# Patient Record
Sex: Female | Born: 1970 | Race: White | Hispanic: No | Marital: Married | Smoking: Never smoker
Health system: Southern US, Community
[De-identification: ages and names within clinical notes are randomized; demographics above are authoritative.]

## PROBLEM LIST (undated history)

## (undated) DIAGNOSIS — E079 Disorder of thyroid, unspecified: Secondary | ICD-10-CM

## (undated) DIAGNOSIS — T7840XA Allergy, unspecified, initial encounter: Secondary | ICD-10-CM

## (undated) DIAGNOSIS — I10 Essential (primary) hypertension: Secondary | ICD-10-CM

## (undated) HISTORY — DX: Essential (primary) hypertension: I10

## (undated) HISTORY — DX: Allergy, unspecified, initial encounter: T78.40XA

---
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
## 1999-01-20 ENCOUNTER — Other Ambulatory Visit: Admission: RE | Admit: 1999-01-20 | Discharge: 1999-01-20 | Payer: Self-pay | Admitting: Obstetrics and Gynecology

## 2001-03-30 ENCOUNTER — Ambulatory Visit (HOSPITAL_COMMUNITY): Admission: RE | Admit: 2001-03-30 | Discharge: 2001-03-30 | Payer: Self-pay | Admitting: Obstetrics and Gynecology

## 2001-03-30 ENCOUNTER — Encounter: Payer: Self-pay | Admitting: Obstetrics and Gynecology

## 2001-06-07 ENCOUNTER — Other Ambulatory Visit: Admission: RE | Admit: 2001-06-07 | Discharge: 2001-06-07 | Payer: Self-pay | Admitting: Obstetrics and Gynecology

## 2001-12-23 ENCOUNTER — Inpatient Hospital Stay (HOSPITAL_COMMUNITY): Admission: AD | Admit: 2001-12-23 | Discharge: 2001-12-25 | Payer: Self-pay | Admitting: Obstetrics and Gynecology

## 2001-12-26 ENCOUNTER — Encounter: Admission: RE | Admit: 2001-12-26 | Discharge: 2002-01-25 | Payer: Self-pay | Admitting: Obstetrics and Gynecology

## 2002-01-23 ENCOUNTER — Other Ambulatory Visit: Admission: RE | Admit: 2002-01-23 | Discharge: 2002-01-23 | Payer: Self-pay | Admitting: Obstetrics and Gynecology

## 2003-03-29 ENCOUNTER — Other Ambulatory Visit: Admission: RE | Admit: 2003-03-29 | Discharge: 2003-03-29 | Payer: Self-pay | Admitting: Obstetrics and Gynecology

## 2004-05-28 ENCOUNTER — Other Ambulatory Visit: Admission: RE | Admit: 2004-05-28 | Discharge: 2004-05-28 | Payer: Self-pay | Admitting: Obstetrics and Gynecology

## 2004-06-15 ENCOUNTER — Emergency Department (HOSPITAL_COMMUNITY): Admission: EM | Admit: 2004-06-15 | Discharge: 2004-06-15 | Payer: Self-pay | Admitting: Emergency Medicine

## 2004-06-16 ENCOUNTER — Emergency Department (HOSPITAL_COMMUNITY): Admission: EM | Admit: 2004-06-16 | Discharge: 2004-06-16 | Payer: Self-pay | Admitting: Emergency Medicine

## 2005-10-26 ENCOUNTER — Other Ambulatory Visit: Admission: RE | Admit: 2005-10-26 | Discharge: 2005-10-26 | Payer: Self-pay | Admitting: Obstetrics and Gynecology

## 2006-12-08 ENCOUNTER — Inpatient Hospital Stay (HOSPITAL_COMMUNITY): Admission: RE | Admit: 2006-12-08 | Discharge: 2006-12-10 | Payer: Self-pay | Admitting: Obstetrics and Gynecology

## 2015-10-29 DIAGNOSIS — Z6833 Body mass index (BMI) 33.0-33.9, adult: Secondary | ICD-10-CM | POA: Diagnosis not present

## 2015-10-29 DIAGNOSIS — Z01419 Encounter for gynecological examination (general) (routine) without abnormal findings: Secondary | ICD-10-CM | POA: Diagnosis not present

## 2015-10-29 DIAGNOSIS — E049 Nontoxic goiter, unspecified: Secondary | ICD-10-CM | POA: Diagnosis not present

## 2015-10-29 DIAGNOSIS — Z1231 Encounter for screening mammogram for malignant neoplasm of breast: Secondary | ICD-10-CM | POA: Diagnosis not present

## 2015-10-30 ENCOUNTER — Other Ambulatory Visit (HOSPITAL_COMMUNITY): Payer: Self-pay | Admitting: Obstetrics and Gynecology

## 2015-10-30 DIAGNOSIS — E049 Nontoxic goiter, unspecified: Secondary | ICD-10-CM

## 2015-11-06 ENCOUNTER — Ambulatory Visit (HOSPITAL_COMMUNITY)
Admission: RE | Admit: 2015-11-06 | Discharge: 2015-11-06 | Disposition: A | Discharge status: Home / Self Care | Payer: 59 | Source: Ambulatory Visit | Attending: Obstetrics and Gynecology | Admitting: Obstetrics and Gynecology

## 2015-11-06 DIAGNOSIS — R590 Localized enlarged lymph nodes: Secondary | ICD-10-CM | POA: Insufficient documentation

## 2015-11-06 DIAGNOSIS — E049 Nontoxic goiter, unspecified: Secondary | ICD-10-CM | POA: Diagnosis not present

## 2015-11-06 DIAGNOSIS — E079 Disorder of thyroid, unspecified: Secondary | ICD-10-CM | POA: Diagnosis not present

## 2015-11-10 ENCOUNTER — Other Ambulatory Visit: Payer: Self-pay | Admitting: Surgery

## 2015-11-10 DIAGNOSIS — E041 Nontoxic single thyroid nodule: Secondary | ICD-10-CM

## 2015-11-25 ENCOUNTER — Other Ambulatory Visit (HOSPITAL_COMMUNITY)
Admission: RE | Admit: 2015-11-25 | Discharge: 2015-11-25 | Disposition: A | Discharge status: Home / Self Care | Payer: 59 | Source: Ambulatory Visit | Attending: Interventional Radiology | Admitting: Interventional Radiology

## 2015-11-25 ENCOUNTER — Ambulatory Visit
Admission: RE | Admit: 2015-11-25 | Discharge: 2015-11-25 | Disposition: A | Discharge status: Home / Self Care | Payer: 59 | Source: Ambulatory Visit | Attending: Surgery | Admitting: Surgery

## 2015-11-25 DIAGNOSIS — E041 Nontoxic single thyroid nodule: Secondary | ICD-10-CM

## 2015-11-25 DIAGNOSIS — E0789 Other specified disorders of thyroid: Secondary | ICD-10-CM | POA: Diagnosis not present

## 2015-11-25 DIAGNOSIS — E042 Nontoxic multinodular goiter: Secondary | ICD-10-CM | POA: Diagnosis not present

## 2015-12-01 DIAGNOSIS — E042 Nontoxic multinodular goiter: Secondary | ICD-10-CM | POA: Diagnosis not present

## 2016-04-01 DIAGNOSIS — J029 Acute pharyngitis, unspecified: Secondary | ICD-10-CM | POA: Diagnosis not present

## 2016-05-29 ENCOUNTER — Encounter (HOSPITAL_COMMUNITY): Payer: Self-pay | Admitting: Family Medicine

## 2016-05-29 ENCOUNTER — Ambulatory Visit (HOSPITAL_COMMUNITY)
Admission: EM | Admit: 2016-05-29 | Discharge: 2016-05-29 | Disposition: A | Discharge status: Home / Self Care | Payer: 59 | Attending: Internal Medicine | Admitting: Internal Medicine

## 2016-05-29 ENCOUNTER — Ambulatory Visit (INDEPENDENT_AMBULATORY_CARE_PROVIDER_SITE_OTHER): Payer: 59

## 2016-05-29 DIAGNOSIS — M25561 Pain in right knee: Secondary | ICD-10-CM

## 2016-05-29 DIAGNOSIS — S92301A Fracture of unspecified metatarsal bone(s), right foot, initial encounter for closed fracture: Secondary | ICD-10-CM

## 2016-05-29 DIAGNOSIS — M79671 Pain in right foot: Secondary | ICD-10-CM | POA: Diagnosis not present

## 2016-05-29 DIAGNOSIS — S8991XA Unspecified injury of right lower leg, initial encounter: Secondary | ICD-10-CM | POA: Diagnosis not present

## 2016-05-29 DIAGNOSIS — S99921A Unspecified injury of right foot, initial encounter: Secondary | ICD-10-CM | POA: Diagnosis not present

## 2016-05-29 NOTE — ED Triage Notes (Signed)
Pt here for pain in right knee and right foot after hearing a pop in her right knee today. sts that her legs were knocked out from under her causing her to fall. sts the pain is worse in her foot.

## 2016-05-29 NOTE — ED Provider Notes (Signed)
CSN: [id number]     Arrival date & time 05/29/16  1350 History   First MD Initiated Contact with Patient 05/29/16 1509     Chief Complaint  Patient presents with  . Knee Pain  . Foot Pain   (Consider location/radiation/quality/duration/timing/severity/associated sxs/prior Treatment) HPI 45 Y/O FEMALE AT [employer], WAS PLAYING WITH KIDS, WHEN SHE WAS HIT CAUSING HER RIGHT KNEE TO POP, AND TWISTING RIGHT FOOT, WITH ONSET OF IMMEDIATE PAIN. UNABLE TO WEIGHT BEAR. TRANSPORTED BY PRIVATE CAR. NO FRACTURE BEFORE.  History reviewed. No pertinent past medical history. History reviewed. No pertinent surgical history. History reviewed. No pertinent family history. Social History  Substance Use Topics  . Smoking status: Never Smoker  . Smokeless tobacco: Never Used  . Alcohol use Not on file   OB History    No data available     Review of Systems  Denies: HEADACHE, NAUSEA, ABDOMINAL PAIN, CHEST PAIN, CONGESTION, DYSURIA, SHORTNESS OF BREATH  Allergies  Ceftin [cefuroxime axetil]  Home Medications   Prior to Admission medications   Not on File   Meds Ordered and Administered this Visit  Medications - No data to display  BP 139/99   Pulse 92   Temp 98.3 F (36.8 C)   Resp 18   LMP 05/14/2016   SpO2 100%  No data found.   Physical Exam NURSES NOTES AND VITAL SIGNS REVIEWED. CONSTITUTIONAL: Well developed, well nourished, no acute distress HEENT: normocephalic, atraumatic EYES: Conjunctiva normal NECK:normal ROM, supple, no adenopathy PULMONARY:No respiratory distress, normal effort ABDOMINAL: Soft, ND, NT BS+, No CVAT MUSCULOSKELETAL: Normal ROM of all extremities, RIGHT KNEE IS WITHOUT SWELLING, TENDER LATERALLY BUT JOINT IS STABLE TO VALGUS AND VARUS STRESS.  THERE IS TENDERNESS AND SWELLING WITH SMALL AMOUNT OF BRUSING OVER TOP OF FOOT. NEAR 4-5 MT BASE.  SKIN: warm and dry without rash PSYCHIATRIC: Mood and affect, behavior are normal  Urgent Care Course    Clinical Course    Procedures (including critical care time)  Labs Review Labs Reviewed - No data to display  Imaging Review Dg Knee Complete 4 Views Right  Result Date: 05/29/2016 CLINICAL DATA:  Pain after trauma EXAM: RIGHT KNEE - COMPLETE 4+ VIEW COMPARISON:  None. FINDINGS: No evidence of fracture, dislocation, or joint effusion. No evidence of arthropathy or other focal bone abnormality. Soft tissues are unremarkable. IMPRESSION: Negative. Electronically Signed   By: Gerome Samavid  Williams III M.D   On: 05/29/2016 16:55   Dg Foot Complete Right  Result Date: 05/29/2016 CLINICAL DATA:  Pain after trauma EXAM: RIGHT FOOT COMPLETE - 3+ VIEW COMPARISON:  None. FINDINGS: Mild irregularity at the base of the fourth metatarsal. Degenerative changes. No other abnormalities. IMPRESSION: Mild irregularity at the base of the fourth metatarsal only seen on the oblique view. A subtle fracture is not excluded on these images. Recommend clinical correlation to assess for point tenderness in this region. No other abnormalities. Electronically Signed   By: Gerome Samavid  Williams III M.D   On: 05/29/2016 16:59   DISCUSSED WITH PATIENT AND HUSBANDE PRIOR TO DISCHARGE.   Visual Acuity Review  Right Eye Distance:   Left Eye Distance:   Bilateral Distance:    Right Eye Near:   Left Eye Near:    Bilateral Near:        45 Y/O FEMALE WITH INJURY TO RIGHT LEG. FX OF 4TH MT, KNEE SPRAIN, CAM WALKER, CRUTCHES PODIATRY FOLLOW UP MDM   1. Knee pain, acute, right   2. Metatarsal fracture, right,  closed, initial encounter     Patient is reassured that there are no issues that require transfer to higher level of care at this time or additional tests. Patient is advised to continue home symptomatic treatment. Patient is advised that if there are new or worsening symptoms to attend the emergency department, contact primary care provider, or return to UC. Instructions of care provided discharged home in stable  condition.    THIS NOTE WAS GENERATED USING A VOICE RECOGNITION SOFTWARE PROGRAM. ALL REASONABLE EFFORTS  WERE MADE TO PROOFREAD THIS DOCUMENT FOR ACCURACY.  I have verbally reviewed the discharge instructions with the patient. A printed AVS was given to the patient.  All questions were answered prior to discharge.      Tharon AquasFrank C Patrick, PA 05/30/16 1009

## 2016-05-31 DIAGNOSIS — M79671 Pain in right foot: Secondary | ICD-10-CM | POA: Diagnosis not present

## 2016-05-31 DIAGNOSIS — S92344A Nondisplaced fracture of fourth metatarsal bone, right foot, initial encounter for closed fracture: Secondary | ICD-10-CM | POA: Diagnosis not present

## 2016-05-31 DIAGNOSIS — S838X1A Sprain of other specified parts of right knee, initial encounter: Secondary | ICD-10-CM | POA: Diagnosis not present

## 2016-06-10 DIAGNOSIS — S92344D Nondisplaced fracture of fourth metatarsal bone, right foot, subsequent encounter for fracture with routine healing: Secondary | ICD-10-CM | POA: Diagnosis not present

## 2016-07-01 DIAGNOSIS — S92344D Nondisplaced fracture of fourth metatarsal bone, right foot, subsequent encounter for fracture with routine healing: Secondary | ICD-10-CM | POA: Diagnosis not present

## 2016-07-01 DIAGNOSIS — S99821D Other specified injuries of right foot, subsequent encounter: Secondary | ICD-10-CM | POA: Diagnosis not present

## 2016-10-07 IMAGING — US US THYROID BIOPSY
1 series · 14 of 22 positions shown · non-contrast
Comparison: 11/06/2015

CLINICAL DATA: Dominant isthmus and left thyroid nodules

EXAM:
ULTRASOUND GUIDED NEEDLE ASPIRATE BIOPSY OF THE THYROID GLAND

[Series 1: us thyroid biopsy · 0.06mm/px · 22 acquisitions, 14 frames shown]
[im 1/22]
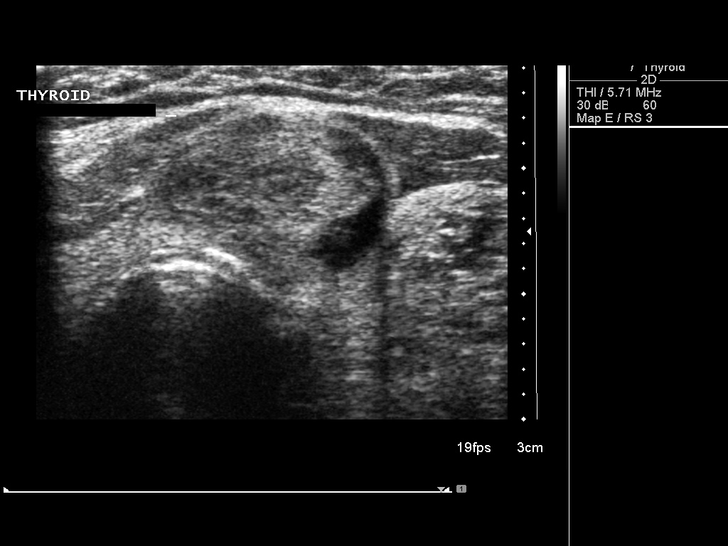
[im 3/22]
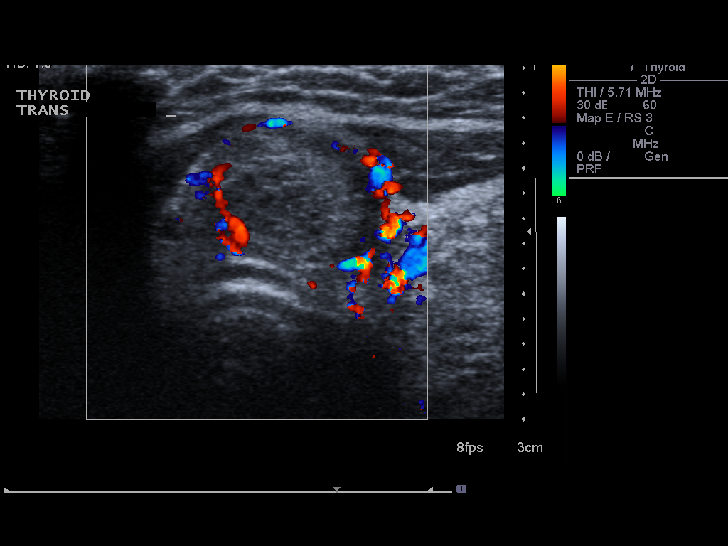
[im 4/22]
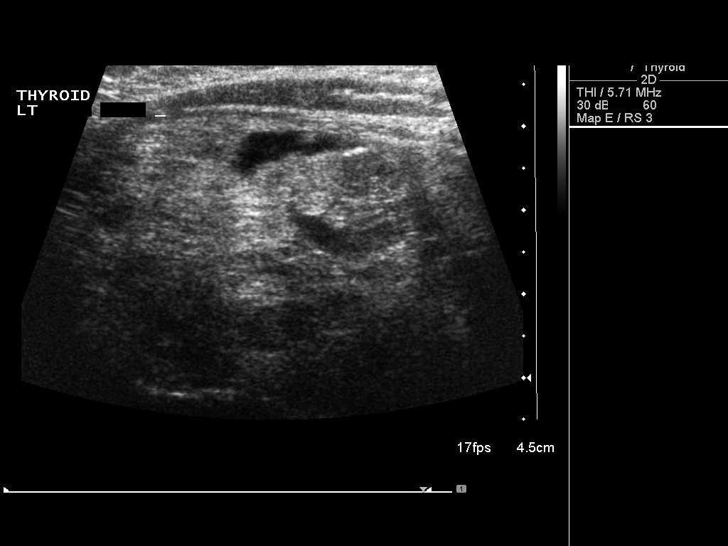
[im 6/22]
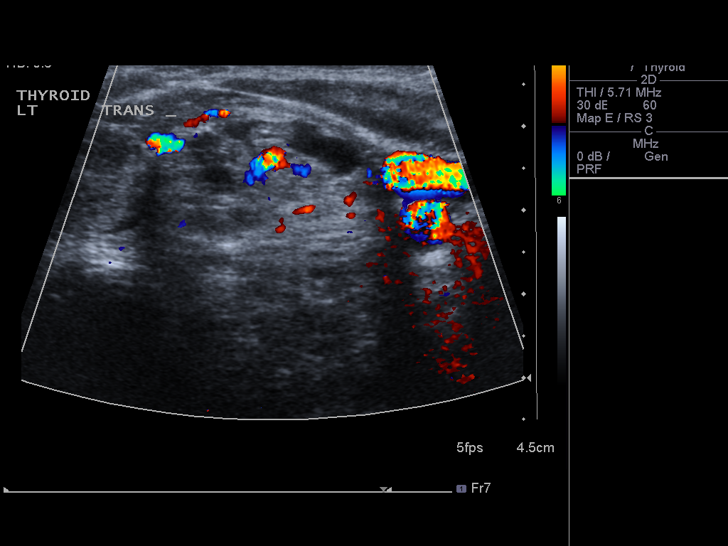
[im 8/22]
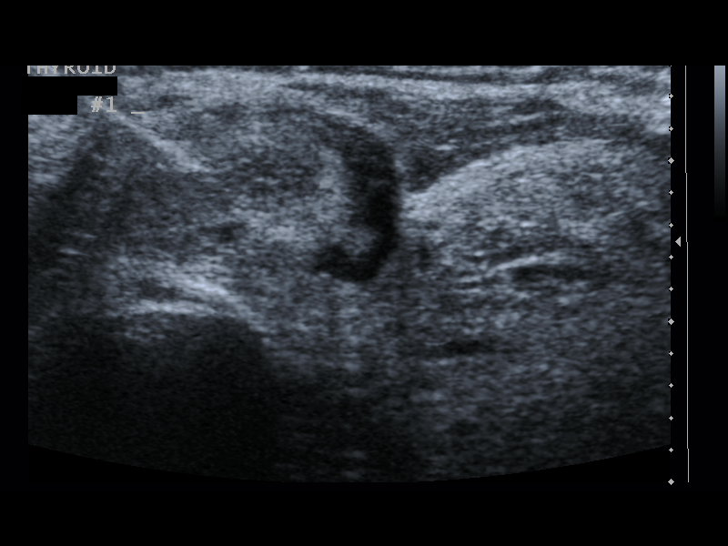
[im 9/22]
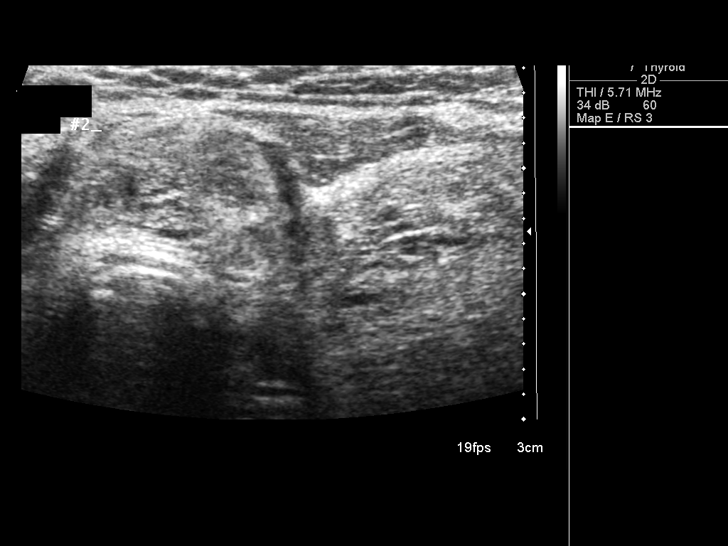
[im 11/22]
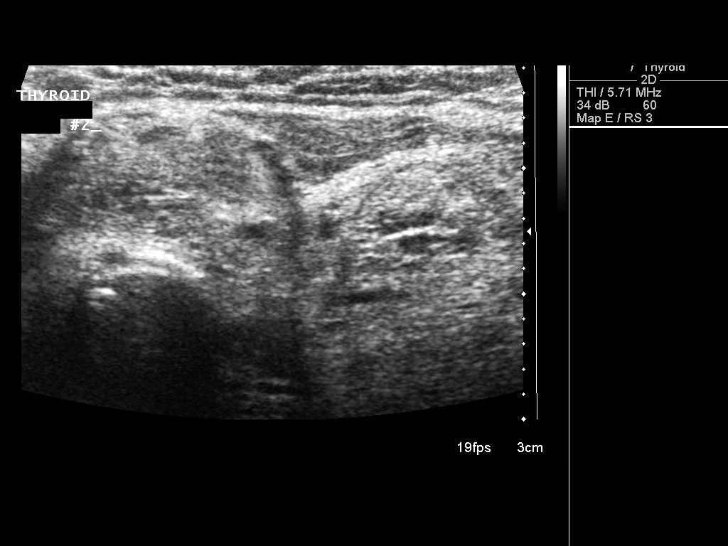
[im 12/22]
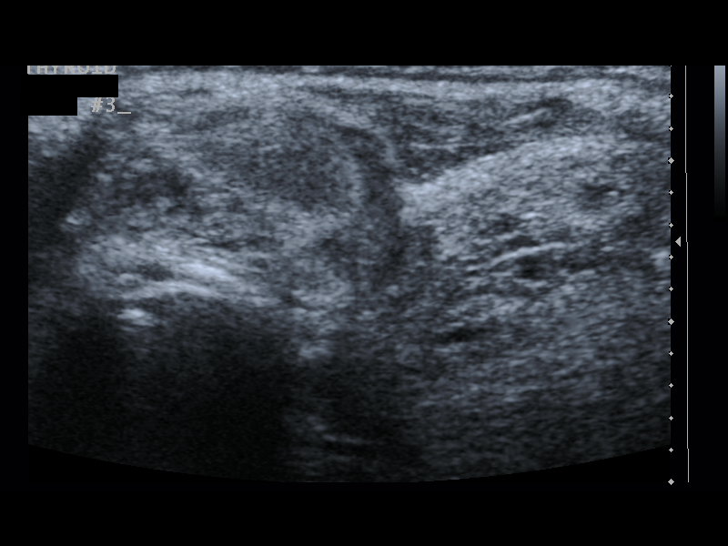
[im 14/22]
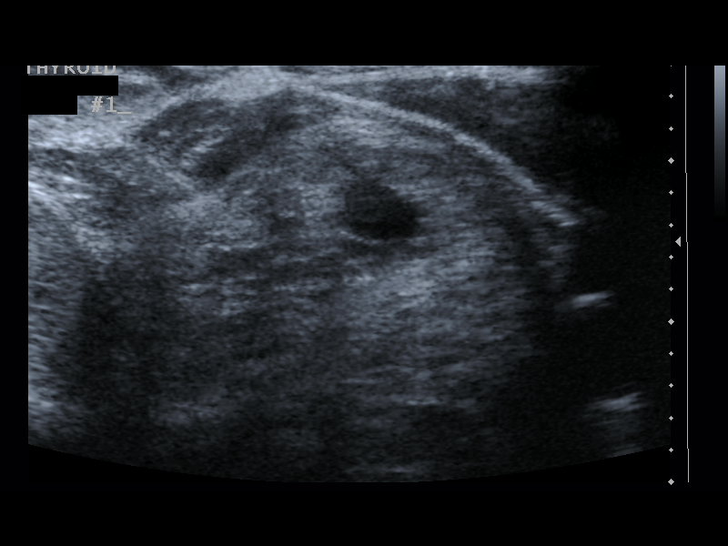
[im 15/22]
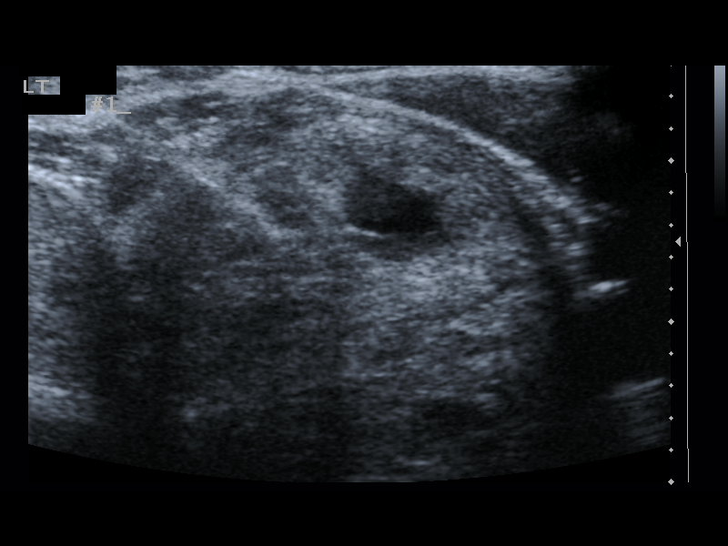
[im 17/22]
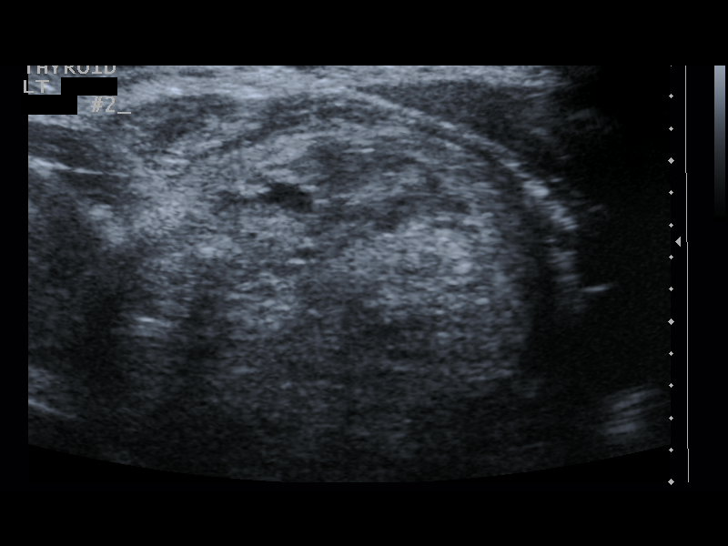
[im 19/22]
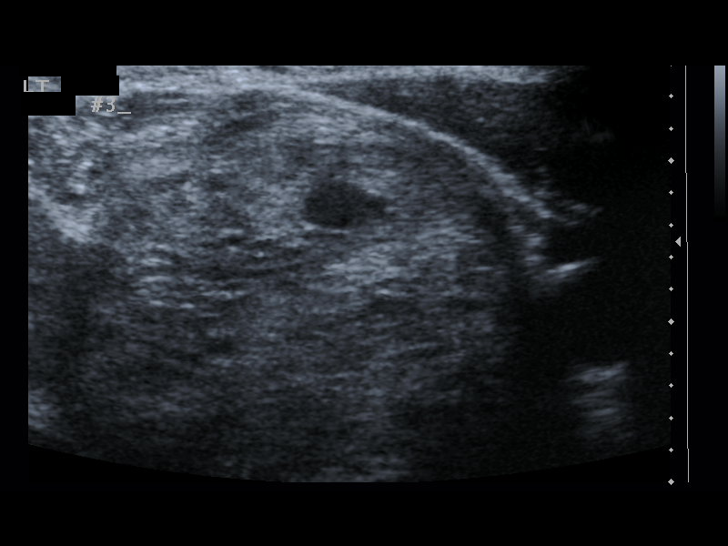
[im 20/22]
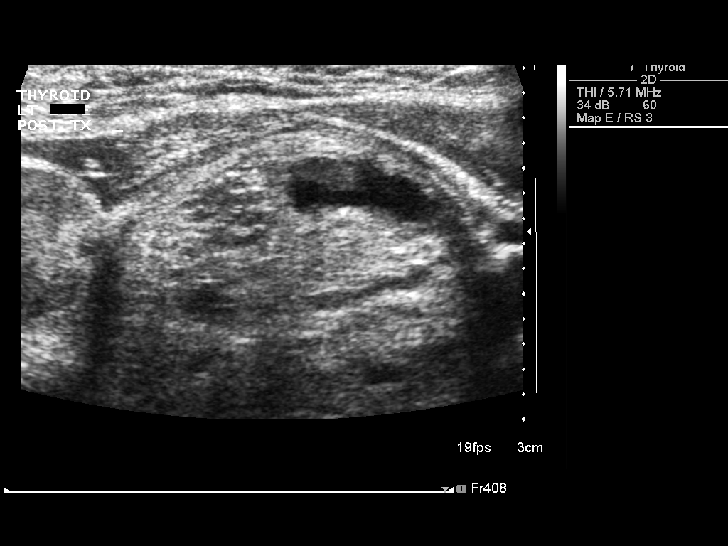
[im 22/22]
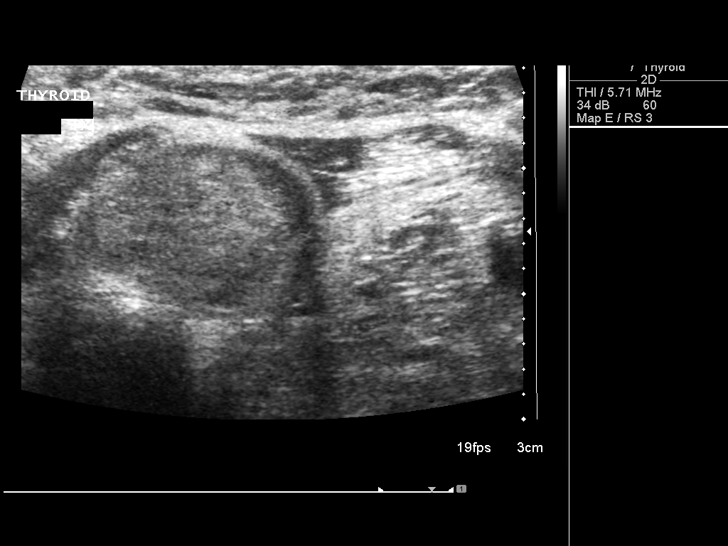

[14 of 22 positions shown; findings below may reference images not displayed]

PROCEDURE:
Thyroid biopsy was thoroughly discussed with the patient and
questions were answered. The benefits, risks, alternatives, and
complications were also discussed. The patient understands and
wishes to proceed with the procedure. Written consent was obtained.

Ultrasound was performed to localize and mark an adequate site for
the biopsy. The patient was then prepped and draped in a normal
sterile fashion. Local anesthesia was provided with 1% lidocaine.
Using direct ultrasound guidance, 3 passes were made using needles
into the nodule within the left lobe of the thyroid. Ultrasound was
used to confirm needle placements on all occasions. Specimens were
sent to Pathology for analysis.

Complications:  None
FINDINGS: Imaging confirms needle placement in the dominant left thyroid
nodule.
IMPRESSION: Ultrasound guided needle aspirate biopsy performed of the dominant
left thyroid nodule.

## 2016-10-14 ENCOUNTER — Other Ambulatory Visit: Payer: Self-pay | Admitting: Surgery

## 2016-10-14 DIAGNOSIS — E041 Nontoxic single thyroid nodule: Secondary | ICD-10-CM

## 2016-10-15 ENCOUNTER — Other Ambulatory Visit (HOSPITAL_COMMUNITY)
Admission: RE | Admit: 2016-10-15 | Discharge: 2016-10-15 | Disposition: A | Discharge status: Home / Self Care | Payer: Commercial Managed Care - PPO | Source: Ambulatory Visit | Attending: Surgery | Admitting: Surgery

## 2016-10-15 DIAGNOSIS — E042 Nontoxic multinodular goiter: Secondary | ICD-10-CM | POA: Diagnosis present

## 2016-10-15 LAB — TSH: TSH: 0.997 u[IU]/mL (ref 0.350–4.500)

## 2016-10-18 ENCOUNTER — Ambulatory Visit
Admission: RE | Admit: 2016-10-18 | Discharge: 2016-10-18 | Disposition: A | Discharge status: Home / Self Care | Payer: Commercial Managed Care - PPO | Source: Ambulatory Visit | Attending: Surgery | Admitting: Surgery

## 2016-10-18 DIAGNOSIS — E041 Nontoxic single thyroid nodule: Secondary | ICD-10-CM

## 2016-10-19 NOTE — Progress Notes (Signed)
Perfect!  tmg  Tracy Hecklerodd M. Wendolyn Raso, MD, [facility] Office: [phone]

## 2017-09-13 ENCOUNTER — Other Ambulatory Visit: Payer: Self-pay | Admitting: Surgery

## 2017-09-13 DIAGNOSIS — E041 Nontoxic single thyroid nodule: Secondary | ICD-10-CM

## 2017-10-19 ENCOUNTER — Telehealth: Payer: Self-pay | Admitting: Radiology

## 2017-10-25 ENCOUNTER — Ambulatory Visit (HOSPITAL_COMMUNITY)
Admission: RE | Admit: 2017-10-25 | Discharge: 2017-10-25 | Disposition: A | Discharge status: Home / Self Care | Payer: Commercial Managed Care - PPO | Source: Ambulatory Visit | Attending: Surgery | Admitting: Surgery

## 2017-10-25 DIAGNOSIS — E041 Nontoxic single thyroid nodule: Secondary | ICD-10-CM | POA: Diagnosis present

## 2017-10-25 DIAGNOSIS — E010 Iodine-deficiency related diffuse (endemic) goiter: Secondary | ICD-10-CM | POA: Diagnosis not present

## 2018-09-12 ENCOUNTER — Emergency Department (HOSPITAL_COMMUNITY)
Admission: EM | Admit: 2018-09-12 | Discharge: 2018-09-12 | Disposition: A | Discharge status: Home / Self Care | Payer: Commercial Managed Care - PPO | Attending: Emergency Medicine | Admitting: Emergency Medicine

## 2018-09-12 ENCOUNTER — Encounter (HOSPITAL_COMMUNITY): Payer: Self-pay | Admitting: *Deleted

## 2018-09-12 ENCOUNTER — Emergency Department (HOSPITAL_COMMUNITY): Payer: Commercial Managed Care - PPO

## 2018-09-12 DIAGNOSIS — M5126 Other intervertebral disc displacement, lumbar region: Secondary | ICD-10-CM

## 2018-09-12 DIAGNOSIS — M545 Low back pain: Secondary | ICD-10-CM | POA: Diagnosis present

## 2018-09-12 MED ORDER — OXYCODONE-ACETAMINOPHEN 5-325 MG PO TABS
1.0000 | ORAL_TABLET | Freq: Once | ORAL | Status: AC
  Administered 2018-09-12: 1 via ORAL
  Filled 2018-09-12: qty 1

## 2018-09-12 MED ORDER — OXYCODONE HCL 5 MG PO TABS: 2.5000 mg | ORAL_TABLET | Freq: Four times a day (QID) | ORAL | 0 refills | Status: DC | PRN

## 2018-09-12 MED ORDER — LORAZEPAM 2 MG/ML IJ SOLN: 0.5000 mg | INTRAMUSCULAR | Status: DC | PRN

## 2018-09-12 NOTE — ED Provider Notes (Signed)
[facility] EMERGENCY DEPARTMENT Provider Note   CSN: [id number] Arrival date & time: 09/12/18  0348     History   Chief Complaint Chief Complaint  Patient presents with  . Back Pain  . Leg Pain    HPI LATORI BEGGS is a 47 y.o. female who presents with cc of left sciatic pain.  The patient just completed 12 day prednisone taper prescribed by Dr. Charlann Boxer at [facility]. The patient presents because yesteday she leaned over to pick something up off the floor and as she soot up felt a pop in her left low back. She had immediate severe pain described as electric, burning, and radiating in the sciatic nerve distribution of the left leg. She was unable to find any position of comfort. Any movement or ambulation made her pain worse. Denies weakness, loss of bowel/bladder function or saddle anesthesia.   Denies fever or recent procedures to back. No HX of IVDU.   The history is provided by the patient. No language interpreter was used.  Back Pain   This is a recurrent problem. The current episode started 12 to 24 hours ago. The problem occurs constantly. The problem has been rapidly worsening. The pain is associated with no known injury. The pain is present in the lumbar spine. The quality of the pain is described as shooting, burning and aching. The pain radiates to the left thigh and left foot (Posterior left leg). The pain is at a severity of 8/10. The pain is severe. The symptoms are aggravated by bending, twisting and certain positions. The pain is the same all the time. Associated symptoms include leg pain. Pertinent negatives include no chest pain, no fever, no numbness, no weight loss, no headaches, no abdominal pain, no abdominal swelling, no bowel incontinence, no perianal numbness, no bladder incontinence, no dysuria, no pelvic pain, no paresthesias, no paresis, no tingling and no weakness. She has tried NSAIDs (patient given percocet in waiting room with marked improvement  in pain) for the symptoms.  Leg Pain   Pertinent negatives include no numbness and no tingling.    History reviewed. No pertinent past medical history.  There are no active problems to display for this patient.   History reviewed. No pertinent surgical history.   OB History   None      Home Medications    Prior to Admission medications   Not on File    Family History No family history on file.  Social History Social History   Tobacco Use  . Smoking status: Never Smoker  . Smokeless tobacco: Never Used  Substance Use Topics  . Alcohol use: Not on file  . Drug use: Not on file     Allergies   Ceftin [cefuroxime axetil]   Review of Systems Review of Systems  Constitutional: Negative.  Negative for fever and weight loss.  HENT: Negative.   Respiratory: Negative.   Cardiovascular: Negative.  Negative for chest pain.  Gastrointestinal: Negative.  Negative for abdominal pain and bowel incontinence.  Genitourinary: Negative.  Negative for bladder incontinence, dysuria and pelvic pain.  Musculoskeletal: Positive for back pain.  Skin: Negative.   Neurological: Negative.  Negative for tingling, weakness, numbness, headaches and paresthesias.  Hematological: Negative.   Psychiatric/Behavioral: Negative.      Physical Exam Updated Vital Signs BP (!) 141/95 (BP Location: Left Arm)   Pulse 95   Temp 98.3 F (36.8 C) (Oral)   SpO2 100%   Physical Exam  Constitutional: She is  oriented to person, place, and time. She appears well-developed and well-nourished. No distress.  HENT:  Head: Normocephalic and atraumatic.  Eyes: Conjunctivae are normal. No scleral icterus.  Neck: Normal range of motion.  Cardiovascular: Normal rate, regular rhythm and normal heart sounds. Exam reveals no gallop and no friction rub.  No murmur heard. Pulmonary/Chest: Effort normal and breath sounds normal. No respiratory distress.  Abdominal: Soft. Bowel sounds are normal. She  exhibits no distension and no mass. There is no tenderness. There is no guarding.  Musculoskeletal:  Patient appears to be in mild to moderate pain, antalgic gait noted. Lumbosacral spine area reveals no local tenderness or mass. Painful and reduced LS ROM noted. Straight leg raise is positive at 45 degrees on left. DTR's, motor strength and sensation normal, including heel and toe gait.  Peripheral pulses are palpable.   Neurological: She is alert and oriented to person, place, and time.  Skin: Skin is warm and dry. She is not diaphoretic.  Psychiatric: Her behavior is normal.  Nursing note and vitals reviewed.    ED Treatments / Results  Labs (all labs ordered are listed, but only abnormal results are displayed) Labs Reviewed - No data to display  EKG None  Radiology No results found.  Procedures Procedures (including critical care time)  Medications Ordered in ED Medications  oxyCODONE-acetaminophen (PERCOCET/ROXICET) 5-325 MG per tablet 1 tablet (1 tablet Oral Given 09/12/18 0426)     Initial Impression / Assessment and Plan / ED Course  I have reviewed the triage vital signs and the nursing notes.  Pertinent labs & imaging results that were available during my care of the patient were reviewed by me and considered in my medical decision making (see chart for details).  Clinical Course as of Sep 12 1645  Tue Sep 12, 2018  0746 No controlled substances on the [place] PMP system   [AH]    Clinical Course User Index [AH] Arthor CaptainHarris, Pranish Akhavan, PA-C    Patient MRI shows ruptured/bulging L4-L5 disc on the left which is likely the culprit causing her severe sciatic pain.  She has had market improvement with a single Percocet here in the emergency department without return of her severe pain.  Have given the patient referral to neurosurgery.  She will have a prescription for oxycodone at discharge.  I discussed return precautions and appropriate follow-up with the patient.  She has no  red flag symptoms.  Final Clinical Impressions(s) / ED Diagnoses   Final diagnoses:  None    ED Discharge Orders    None       Arthor CaptainHarris, Nicoli Nardozzi, PA-C 09/12/18 1647    Tegeler, Canary Brimhristopher J, MD 09/12/18 [phone]

## 2018-09-12 NOTE — ED Notes (Signed)
Taken to MRI 

## 2018-09-12 NOTE — ED Triage Notes (Signed)
To ED for eval of lower back pain with radiation down left leg. Pt just finished prednisone dose pack for sciatic pain to right side. Dr Charlann Boxerlin placed on prednisone. Bent over at work yesterday and felt a pop - then increased pain. No change in bowel or bladder.

## 2018-09-12 NOTE — Discharge Instructions (Addendum)
You were given narcotic and or sedative medications while in the emergency department. °Do not drive. °Do not use machinery or power tools. °Do not sign legal documents. °Do not drink alcohol. °Do not take sleeping pills. °Do not supervise children by yourself. °Do not participate in activities that require climbing or being in high places. ° °

## 2018-09-12 NOTE — ED Notes (Signed)
Patient verbalizes understanding of discharge instructions. Opportunity for questioning and answers were provided. Armband removed by staff, pt discharged from ED. Ambulated out of department with spouse

## 2018-09-13 DIAGNOSIS — M545 Low back pain, unspecified: Secondary | ICD-10-CM | POA: Insufficient documentation

## 2018-09-15 DIAGNOSIS — M5417 Radiculopathy, lumbosacral region: Secondary | ICD-10-CM | POA: Insufficient documentation

## 2018-09-15 DIAGNOSIS — M5136 Other intervertebral disc degeneration, lumbar region: Secondary | ICD-10-CM | POA: Insufficient documentation

## 2018-09-15 DIAGNOSIS — M51369 Other intervertebral disc degeneration, lumbar region without mention of lumbar back pain or lower extremity pain: Secondary | ICD-10-CM | POA: Insufficient documentation

## 2018-11-18 ENCOUNTER — Other Ambulatory Visit: Payer: Self-pay | Admitting: Surgery

## 2018-11-18 DIAGNOSIS — E042 Nontoxic multinodular goiter: Secondary | ICD-10-CM

## 2018-12-11 ENCOUNTER — Ambulatory Visit
Admission: RE | Admit: 2018-12-11 | Discharge: 2018-12-11 | Disposition: A | Discharge status: Home / Self Care | Payer: Commercial Managed Care - PPO | Source: Ambulatory Visit | Attending: Surgery | Admitting: Surgery

## 2018-12-11 DIAGNOSIS — E042 Nontoxic multinodular goiter: Secondary | ICD-10-CM

## 2019-06-13 ENCOUNTER — Other Ambulatory Visit: Payer: Self-pay | Admitting: *Deleted

## 2019-07-02 ENCOUNTER — Ambulatory Visit: Payer: Self-pay | Admitting: Surgery

## 2019-07-20 NOTE — Patient Instructions (Addendum)
DUE TO COVID-19 ONLY ONE VISITOR IS ALLOWED TO COME WITH YOU AND STAY IN THE WAITING ROOM ONLY DURING PRE OP AND PROCEDURE DAY OF SURGERY. THE 1 VISITOR MAY VISIT WITH YOU AFTER SURGERY IN YOUR PRIVATE ROOM DURING VISITING HOURS ONLY!  YOU NEED TO HAVE A COVID 19 TEST ON_Friday 10/16/2020______ @_3 :05pm______, THIS TEST MUST BE DONE BEFORE SURGERY, COME  [address], [place] , .  [facility]) ONCE YOUR COVID TEST IS COMPLETED, PLEASE BEGIN THE QUARANTINE INSTRUCTIONS AS OUTLINED IN YOUR HANDOUT.                EMILIA KAYES   Your procedure is scheduled on: Tuesday 07/31/2019   Report to [facility] Main  Entrance    Report to admitting at 1:00 PM     Call this number if you have problems the morning of surgery [phone]    Remember: Do not eat food : After Midnight.  May have clear liquids from midnight up until 0900 am then nothing until after surgery!    CLEAR LIQUID DIET   Foods Allowed                                                                     Foods Excluded  Coffee and tea, regular and decaf                             liquids that you cannot  Plain Jell-O any favor except red or purple                                           see through such as: Fruit ices (not with fruit pulp)                                     milk, soups, orange juice  Iced Popsicles                                    All solid food Carbonated beverages, regular and diet                                    Cranberry, grape and apple juices Sports drinks like Gatorade Lightly seasoned clear broth or consume(fat free) Sugar, honey syrup  Sample Menu Breakfast                                Lunch                                     Supper Cranberry juice                    Beef broth  Chicken broth Jell-O                                     Grape juice                           Apple juice Coffee or tea                         Jell-O                                      Popsicle                                                Coffee or tea                        Coffee or tea  _____________________________________________________________________     BRUSH YOUR TEETH MORNING OF SURGERY AND RINSE YOUR MOUTH OUT, NO  CHEWING GUM CANDY OR MINTS.                                   You may not have any metal on your body including hair pins and              piercings  Do not wear jewelry, make-up, lotions, powders or perfumes, deodorant             Do not wear nail polish on your fingernails.  Do not shave  48 hours prior to surgery.               Do not bring valuables to the hospital. [facility] IS NOT             RESPONSIBLE   FOR VALUABLES.  Contacts, dentures or bridgework may not be worn into surgery.  Leave suitcase in the car. After surgery it may be brought to your room.                Please read over the following fact sheets you were given: _____________________________________________________________________             [facility] Before surgery, you can play an important role.  Because skin is not sterile, your skin needs to be as free of germs as possible.  You can reduce the number of germs on your skin by washing with CHG (chlorahexidine gluconate) soap before surgery.  CHG is an antiseptic cleaner which kills germs and bonds with the skin to continue killing germs even after washing. Please DO NOT use if you have an allergy to CHG or antibacterial soaps.  If your skin becomes reddened/irritated stop using the CHG and inform your nurse when you arrive at Short Stay. Do not shave (including legs and underarms) for at least 48 hours prior to the first CHG shower.  You may shave your face/neck. Please follow these instructions carefully:  1.  Shower with CHG Soap the night before surgery and the  morning of Surgery.  2.  If you  choose to wash your hair, wash your hair  first as usual with your  normal  shampoo.  3.  After you shampoo, rinse your hair and body thoroughly to remove the  shampoo.                           4.  Use CHG as you would any other liquid soap.  You can apply chg directly  to the skin and wash                       Gently with a scrungie or clean washcloth.  5.  Apply the CHG Soap to your body ONLY FROM THE NECK DOWN.   Do not use on face/ open                           Wound or open sores. Avoid contact with eyes, ears mouth and genitals (private parts).                       Wash face,  Genitals (private parts) with your normal soap.             6.  Wash thoroughly, paying special attention to the area where your surgery  will be performed.  7.  Thoroughly rinse your body with warm water from the neck down.  8.  DO NOT shower/wash with your normal soap after using and rinsing off  the CHG Soap.                9.  Pat yourself dry with a clean towel.            10.  Wear clean pajamas.            11.  Place clean sheets on your bed the night of your first shower and do not  sleep with pets. Day of Surgery : Do not apply any lotions/deodorants the morning of surgery.  Please wear clean clothes to the hospital/surgery center.  FAILURE TO FOLLOW THESE INSTRUCTIONS MAY RESULT IN THE CANCELLATION OF YOUR SURGERY PATIENT SIGNATURE_________________________________  NURSE SIGNATURE__________________________________  ________________________________________________________________________

## 2019-07-22 ENCOUNTER — Encounter (HOSPITAL_COMMUNITY): Payer: Self-pay | Admitting: Surgery

## 2019-07-22 DIAGNOSIS — E049 Nontoxic goiter, unspecified: Secondary | ICD-10-CM | POA: Diagnosis present

## 2019-07-22 DIAGNOSIS — E042 Nontoxic multinodular goiter: Secondary | ICD-10-CM | POA: Diagnosis present

## 2019-07-22 NOTE — H&P (Signed)
General Surgery [facility]  Tracy Montgomery DOB: 03-Apr-1971 Married / Language: English / Race: White Female   History of Present Illness  The patient is a 48 year old female who presents with a thyroid nodule.  CHIEF COMPLAINT: enlarging thyroid mass  Patient returns to discuss surgery for an enlarging thyroid mass. This is been followed in my practice since 2017. Her most recent ultrasound is from December 11, 2018. This now shows a enlarged left thyroid lobe measuring 7.5 x 3.9 x 3.8 cm. There is a dominant mass in the medial aspect of the left lobe measuring 3.9 x 2.8 x 2.8 cm. Right thyroid lobe is normal in size. Small subcentimeter nodules in the right lobe remains cystic or spongiform and if not significantly changed over the past 3 years. Most recent TSH level remains normal. Patient has never been on thyroid medication. Patient has developed some raspiness and hoarseness on occasion. She denies any dysphagia. She denies any dyspnea. She would like to proceed with surgery in the near future.   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 No alcohol use  No drug use  Tobacco use  Never smoker.  Family History  Arthritis  Father, Mother. Breast Cancer  Mother. Cancer  Mother. Colon Polyps  Father, Mother. Hypertension  Father. Kidney Disease  Mother. Arthritis  Father, Mother.  Pregnancy / Birth History  Age at menarche  40 years Gravida  2 Maternal age  58-30 Para  2 Regular periods   Other Problems  Back Pain  Thyroid Disease   No pertinent past medical history   Review of Systems  General Not Present- Appetite Loss, Chills, Fatigue, Fever, Night Sweats, Weight Gain and Weight Loss. Skin Not Present- Change in Wart/Mole, Dryness, Hives, Jaundice, New Lesions, Non-Healing Wounds, Rash and Ulcer. HEENT Present- Hoarseness. Not Present- Earache, Hearing Loss, Nose Bleed, Oral Ulcers, Ringing in the Ears, Seasonal Allergies, Sinus Pain, Sore Throat, Visual Disturbances, Wears glasses/contact lenses and Yellow Eyes. Respiratory Not Present- Bloody sputum, Chronic Cough, Difficulty Breathing, Snoring and Wheezing. Breast Not Present- Breast Mass, Breast Pain, Nipple Discharge and Skin Changes. Cardiovascular Not Present- Chest Pain, Difficulty Breathing Lying Down, Leg Cramps, Palpitations, Rapid Heart Rate, Shortness of Breath and Swelling of Extremities. Gastrointestinal Not Present- Abdominal Pain, Bloating, Bloody Stool, Change in Bowel Habits, Chronic diarrhea, Constipation, Difficulty Swallowing, Excessive gas, Gets full quickly at meals, Hemorrhoids, Indigestion, Nausea, Rectal Pain and Vomiting. Female Genitourinary Not Present- Frequency, Nocturia, Painful Urination, Pelvic Pain and Urgency. Musculoskeletal Not Present- Back Pain, Joint Pain, Joint Stiffness, Muscle Pain, Muscle Weakness and Swelling of Extremities. Neurological Not Present- Decreased Memory, Fainting, Headaches, Numbness, Seizures, Tingling, Tremor, Trouble walking and Weakness. Psychiatric Not Present- Anxiety, Bipolar, Change in Sleep Pattern, Depression, Fearful and Frequent crying. Endocrine Not Present- Cold Intolerance, Excessive Hunger, Hair Changes, Heat Intolerance, Hot flashes and New Diabetes. Hematology Not Present- Blood Thinners, Easy Bruising, Excessive bleeding, Gland problems, HIV and Persistent Infections.  Vitals Weight: 181.8 lb Height: 62in Body Surface Area: 1.84 m Body Mass Index: 33.25 kg/m  Temp.: 98.25F   Pulse: 116 (Regular)  P.OX: 97% (Room air) BP:  130/85(Sitting, Right Leg, Standard)  Physical Exam   See vital signs recorded above  GENERAL APPEARANCE Development: normal Nutritional status: normal Gross deformities: none  SKIN Rash, lesions, ulcers: none Induration, erythema: none Nodules: none palpable  EYES Conjunctiva and lids: normal Pupils: equal and reactive Iris: normal bilaterally  EARS, NOSE, MOUTH, THROAT External ears: no lesion or deformity External nose: no lesion or deformity Hearing: grossly normal Patient is wearing a mask.  NECK Symmetric: no Trachea: midline Thyroid: Obvious dominant mass in the isthmus of the thyroid extending towards the left. This measures at least 4 cm in maximum dimension. Left lobe is quite full to palpation and firm. There is no tenderness. Right thyroid lobe is normal to palpation without nodularity. There is no evidence of lymphadenopathy. Voice quality is normal.  CHEST Respiratory effort: normal Retraction or accessory muscle use: no Breath sounds: normal bilaterally Rales, rhonchi, wheeze: none  CARDIOVASCULAR Auscultation: regular rhythm, normal rate Murmurs: none Pulses: carotid and radial pulse 2+ palpable Lower extremity edema: none Lower extremity varicosities: none  MUSCULOSKELETAL Station and gait: normal Digits and nails: no clubbing or cyanosis Muscle strength: grossly normal all extremities Range of motion: grossly normal all extremities Deformity: none  LYMPHATIC Cervical: none palpable Supraclavicular: none palpable  PSYCHIATRIC Oriented to person, place, and time: yes Mood and affect: normal for situation Judgment and insight: appropriate for situation    Assessment & Plan  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 Over the past 3 years has been continued enlargement of the dominant mass in the isthmus and  left lobe of the thyroid. The subcentimeter nodules in the right lobe not changed in the right lobe is normal in size. Patient has developed some mild compressive symptoms. Her thyroid function remains normal.  I recommended proceeding with left thyroid lobectomy and isthmusectomy. I would plan to leave the right lobe alone unless there are findings at the time of surgery. This will limit her risk and also make it possible for her not to have to take thyroid hormone supplementation if her levels remained normal following surgery. We discussed the procedure. We discussed the location of the surgical incision. We discussed overnight stay following the procedure at the hospital. We discussed her postoperative recovery and return to work. We discussed the potential need for thyroid hormone replacement. We discussed the potential need for additional surgery. The patient understands and wishes to proceed with surgery in the near future.  The risks and benefits of the procedure have been discussed at length with the patient. The patient understands the proposed procedure, potential alternative treatments, and the course of recovery to be expected. All of the patient's questions have been answered at this time. The patient wishes to proceed with surgery.   Darnell Level, MD [facility] Office: [phone]

## 2019-07-24 ENCOUNTER — Encounter (HOSPITAL_COMMUNITY)
Admission: RE | Admit: 2019-07-24 | Discharge: 2019-07-24 | Disposition: A | Discharge status: Home / Self Care | Payer: No Typology Code available for payment source | Source: Ambulatory Visit | Attending: Surgery | Admitting: Surgery

## 2019-07-24 ENCOUNTER — Other Ambulatory Visit: Payer: Self-pay

## 2019-07-24 ENCOUNTER — Ambulatory Visit (HOSPITAL_COMMUNITY)
Admission: RE | Admit: 2019-07-24 | Discharge: 2019-07-24 | Disposition: A | Discharge status: Home / Self Care | Payer: No Typology Code available for payment source | Source: Ambulatory Visit | Attending: Anesthesiology | Admitting: Anesthesiology

## 2019-07-24 ENCOUNTER — Encounter (HOSPITAL_COMMUNITY): Payer: Self-pay

## 2019-07-24 DIAGNOSIS — Z01811 Encounter for preprocedural respiratory examination: Secondary | ICD-10-CM | POA: Diagnosis present

## 2019-07-24 LAB — CBC
HCT: 38.7 % (ref 36.0–46.0)
Hemoglobin: 12.9 g/dL (ref 12.0–15.0)
MCH: 29.9 pg (ref 26.0–34.0)
MCHC: 33.3 g/dL (ref 30.0–36.0)
MCV: 89.8 fL (ref 80.0–100.0)
Platelets: 290 10*3/uL (ref 150–400)
RBC: 4.31 MIL/uL (ref 3.87–5.11)
RDW: 12 % (ref 11.5–15.5)
WBC: 8.5 10*3/uL (ref 4.0–10.5)
nRBC: 0 % (ref 0.0–0.2)

## 2019-07-24 NOTE — Progress Notes (Signed)
PCP - Haydee Salter FNP  Cardiologist - none  Chest x-ray - 07/24/19 EKG - none Stress Test - none ECHO - none Cardiac Cath - none  Sleep Study - none CPAP - none  Fasting Blood Sugar - NA Checks Blood Sugar _____ times a day  Blood Thinner Instructions: none Aspirin Instructions: none Last Dose:  Anesthesia review:   Patient denies shortness of breath, fever, cough and chest pain at PAT appointment   Patient verbalized understanding of instructions that were given to them at the PAT appointment. Patient was also instructed that they will need to review over the PAT instructions again at home before surgery.

## 2019-07-27 ENCOUNTER — Other Ambulatory Visit (HOSPITAL_COMMUNITY)
Admission: RE | Admit: 2019-07-27 | Discharge: 2019-07-27 | Disposition: A | Discharge status: Home / Self Care | Payer: No Typology Code available for payment source | Source: Ambulatory Visit | Attending: Surgery | Admitting: Surgery

## 2019-07-27 DIAGNOSIS — Z20828 Contact with and (suspected) exposure to other viral communicable diseases: Secondary | ICD-10-CM | POA: Insufficient documentation

## 2019-07-27 DIAGNOSIS — Z01812 Encounter for preprocedural laboratory examination: Secondary | ICD-10-CM | POA: Diagnosis present

## 2019-07-28 LAB — NOVEL CORONAVIRUS, NAA (HOSP ORDER, SEND-OUT TO REF LAB; TAT 18-24 HRS): SARS-CoV-2, NAA: NOT DETECTED

## 2019-07-30 NOTE — Progress Notes (Signed)
Spoke with patient via phone and instructed patient to arrive at Admitting at [facility] at 1245 pm ( time change for surgery). Instructed patient that she can have clear liquids from midnight up until 0845 am then nothing until after surgery. Patient verbalized understanding.

## 2019-07-30 NOTE — Anesthesia Preprocedure Evaluation (Addendum)
Anesthesia Evaluation  Patient identified by MRN, date of birth, ID band Patient awake    Reviewed: Allergy & Precautions, H&P , NPO status , Patient's Chart, lab work & pertinent test results  Airway Mallampati: II  TM Distance: >3 FB Neck ROM: Full    Dental no notable dental hx. (+) Teeth Intact   Pulmonary neg pulmonary ROS,    Pulmonary exam normal breath sounds clear to auscultation       Cardiovascular Exercise Tolerance: Good negative cardio ROS Normal cardiovascular exam Rhythm:Regular Rate:Normal     Neuro/Psych negative neurological ROS  negative psych ROS   GI/Hepatic negative GI ROS, Neg liver ROS,   Endo/Other  negative endocrine ROS  Renal/GU negative Renal ROS  negative genitourinary   Musculoskeletal negative musculoskeletal ROS (+)   Abdominal   Peds negative pediatric ROS (+)  Hematology negative hematology ROS (+)   Anesthesia Other Findings   Reproductive/Obstetrics negative OB ROS                            Anesthesia Physical Anesthesia Plan  ASA: II  Anesthesia Plan: General   Post-op Pain Management:    Induction: Intravenous  PONV Risk Score and Plan: 3 and Dexamethasone, Treatment may vary due to age or medical condition, Propofol infusion and Ondansetron  Airway Management Planned: Oral ETT  Additional Equipment:   Intra-op Plan:   Post-operative Plan: Extubation in OR  Informed Consent: I have reviewed the patients History and Physical, chart, labs and discussed the procedure including the risks, benefits and alternatives for the proposed anesthesia with the patient or authorized representative who has indicated his/her understanding and acceptance.       Plan Discussed with: CRNA, Anesthesiologist and Surgeon  Anesthesia Plan Comments: (  )        Anesthesia Quick Evaluation

## 2019-07-31 ENCOUNTER — Other Ambulatory Visit: Payer: Self-pay

## 2019-07-31 ENCOUNTER — Ambulatory Visit (HOSPITAL_COMMUNITY): Payer: No Typology Code available for payment source | Admitting: Physician Assistant

## 2019-07-31 ENCOUNTER — Encounter (HOSPITAL_COMMUNITY)
Admission: RE | Disposition: A | Discharge status: Home / Self Care | Payer: Self-pay | Source: Ambulatory Visit | Attending: Surgery

## 2019-07-31 ENCOUNTER — Encounter (HOSPITAL_COMMUNITY): Payer: Self-pay | Admitting: *Deleted

## 2019-07-31 ENCOUNTER — Observation Stay (HOSPITAL_COMMUNITY)
Admission: RE | Admit: 2019-07-31 | Discharge: 2019-08-01 | Disposition: A | Discharge status: Home / Self Care | Payer: No Typology Code available for payment source | Source: Ambulatory Visit | Attending: Surgery | Admitting: Surgery

## 2019-07-31 ENCOUNTER — Ambulatory Visit (HOSPITAL_COMMUNITY): Payer: No Typology Code available for payment source | Admitting: Anesthesiology

## 2019-07-31 DIAGNOSIS — E049 Nontoxic goiter, unspecified: Secondary | ICD-10-CM | POA: Diagnosis present

## 2019-07-31 DIAGNOSIS — E042 Nontoxic multinodular goiter: Secondary | ICD-10-CM | POA: Diagnosis present

## 2019-07-31 HISTORY — PX: THYROID LOBECTOMY: SHX420

## 2019-07-31 LAB — PREGNANCY, URINE: Preg Test, Ur: NEGATIVE

## 2019-07-31 SURGERY — LOBECTOMY, THYROID
Anesthesia: General | Site: Neck

## 2019-07-31 MED ORDER — ONDANSETRON HCL 4 MG/2ML IJ SOLN
INTRAMUSCULAR | Status: DC | PRN
  Administered 2019-07-31: 4 mg via INTRAVENOUS

## 2019-07-31 MED ORDER — ACETAMINOPHEN 325 MG PO TABS: 325.0000 mg | ORAL_TABLET | ORAL | Status: DC | PRN

## 2019-07-31 MED ORDER — HYDROMORPHONE HCL 1 MG/ML IJ SOLN
1.0000 mg | INTRAMUSCULAR | Status: DC | PRN
  Administered 2019-07-31: 20:00:00 1 mg via INTRAVENOUS
  Filled 2019-07-31 (×2): qty 1

## 2019-07-31 MED ORDER — ONDANSETRON HCL 4 MG/2ML IJ SOLN
INTRAMUSCULAR | Status: AC
  Filled 2019-07-31: qty 2

## 2019-07-31 MED ORDER — ACETAMINOPHEN 650 MG RE SUPP: 650.0000 mg | Freq: Four times a day (QID) | RECTAL | Status: DC | PRN

## 2019-07-31 MED ORDER — ONDANSETRON HCL 4 MG/2ML IJ SOLN: 4.0000 mg | Freq: Once | INTRAMUSCULAR | Status: DC | PRN

## 2019-07-31 MED ORDER — PROPOFOL 10 MG/ML IV BOLUS
INTRAVENOUS | Status: AC
  Filled 2019-07-31: qty 20

## 2019-07-31 MED ORDER — CIPROFLOXACIN IN D5W 400 MG/200ML IV SOLN
400.0000 mg | INTRAVENOUS | Status: AC
  Administered 2019-07-31: 14:00:00 400 mg via INTRAVENOUS
  Filled 2019-07-31: qty 200

## 2019-07-31 MED ORDER — LIDOCAINE 2% (20 MG/ML) 5 ML SYRINGE
INTRAMUSCULAR | Status: AC
  Filled 2019-07-31: qty 5

## 2019-07-31 MED ORDER — PROPOFOL 10 MG/ML IV BOLUS
INTRAVENOUS | Status: DC | PRN
  Administered 2019-07-31: 160 mg via INTRAVENOUS

## 2019-07-31 MED ORDER — LIDOCAINE HCL 2 % IJ SOLN
INTRAMUSCULAR | Status: AC
  Filled 2019-07-31: qty 20

## 2019-07-31 MED ORDER — HYDROMORPHONE HCL 1 MG/ML IJ SOLN
INTRAMUSCULAR | Status: AC
  Administered 2019-07-31: 0.5 mg via INTRAVENOUS
  Filled 2019-07-31: qty 1

## 2019-07-31 MED ORDER — MIDAZOLAM HCL 5 MG/5ML IJ SOLN
INTRAMUSCULAR | Status: DC | PRN
  Administered 2019-07-31 (×2): 1 mg via INTRAVENOUS

## 2019-07-31 MED ORDER — ACETAMINOPHEN 10 MG/ML IV SOLN
INTRAVENOUS | Status: AC
  Filled 2019-07-31: qty 100

## 2019-07-31 MED ORDER — MEPERIDINE HCL 50 MG/ML IJ SOLN: 6.2500 mg | INTRAMUSCULAR | Status: DC | PRN

## 2019-07-31 MED ORDER — TRAMADOL HCL 50 MG PO TABS
50.0000 mg | ORAL_TABLET | Freq: Four times a day (QID) | ORAL | Status: DC | PRN
  Administered 2019-07-31: 50 mg via ORAL
  Filled 2019-07-31: qty 1

## 2019-07-31 MED ORDER — ROCURONIUM BROMIDE 10 MG/ML (PF) SYRINGE
PREFILLED_SYRINGE | INTRAVENOUS | Status: DC | PRN
  Administered 2019-07-31: 50 mg via INTRAVENOUS
  Administered 2019-07-31 (×3): 10 mg via INTRAVENOUS

## 2019-07-31 MED ORDER — PROMETHAZINE HCL 25 MG/ML IJ SOLN
12.5000 mg | Freq: Three times a day (TID) | INTRAMUSCULAR | Status: DC | PRN
  Administered 2019-07-31: 12.5 mg via INTRAVENOUS
  Filled 2019-07-31: qty 1

## 2019-07-31 MED ORDER — OXYCODONE HCL 5 MG/5ML PO SOLN: 5.0000 mg | Freq: Once | ORAL | Status: DC | PRN

## 2019-07-31 MED ORDER — DEXAMETHASONE SODIUM PHOSPHATE 10 MG/ML IJ SOLN
INTRAMUSCULAR | Status: AC
  Filled 2019-07-31: qty 1

## 2019-07-31 MED ORDER — SUGAMMADEX SODIUM 200 MG/2ML IV SOLN
INTRAVENOUS | Status: DC | PRN
  Administered 2019-07-31: 160 mg via INTRAVENOUS

## 2019-07-31 MED ORDER — ACETAMINOPHEN 10 MG/ML IV SOLN
INTRAVENOUS | Status: DC | PRN
  Administered 2019-07-31: 1000 mg via INTRAVENOUS

## 2019-07-31 MED ORDER — HYDROMORPHONE HCL 1 MG/ML IJ SOLN
0.5000 mg | INTRAMUSCULAR | Status: DC | PRN
  Administered 2019-07-31 (×3): 0.5 mg via INTRAVENOUS

## 2019-07-31 MED ORDER — EPHEDRINE 5 MG/ML INJ
INTRAVENOUS | Status: AC
  Filled 2019-07-31: qty 10

## 2019-07-31 MED ORDER — FENTANYL CITRATE (PF) 100 MCG/2ML IJ SOLN
INTRAMUSCULAR | Status: DC | PRN
  Administered 2019-07-31 (×2): 25 ug via INTRAVENOUS
  Administered 2019-07-31: 50 ug via INTRAVENOUS
  Administered 2019-07-31 (×2): 25 ug via INTRAVENOUS
  Administered 2019-07-31: 50 ug via INTRAVENOUS
  Administered 2019-07-31: 25 ug via INTRAVENOUS
  Administered 2019-07-31: 50 ug via INTRAVENOUS
  Administered 2019-07-31: 25 ug via INTRAVENOUS

## 2019-07-31 MED ORDER — FENTANYL CITRATE (PF) 100 MCG/2ML IJ SOLN
INTRAMUSCULAR | Status: AC
  Filled 2019-07-31: qty 2

## 2019-07-31 MED ORDER — ONDANSETRON HCL 4 MG/2ML IJ SOLN
4.0000 mg | Freq: Four times a day (QID) | INTRAMUSCULAR | Status: DC | PRN
  Administered 2019-07-31: 4 mg via INTRAVENOUS
  Filled 2019-07-31: qty 2

## 2019-07-31 MED ORDER — FENTANYL CITRATE (PF) 100 MCG/2ML IJ SOLN
INTRAMUSCULAR | Status: AC
  Administered 2019-07-31: 50 ug via INTRAVENOUS
  Filled 2019-07-31: qty 2

## 2019-07-31 MED ORDER — KCL IN DEXTROSE-NACL 20-5-0.45 MEQ/L-%-% IV SOLN
INTRAVENOUS | Status: DC
  Administered 2019-07-31: 18:00:00 via INTRAVENOUS
  Filled 2019-07-31: qty 1000

## 2019-07-31 MED ORDER — EPHEDRINE SULFATE-NACL 50-0.9 MG/10ML-% IV SOSY
PREFILLED_SYRINGE | INTRAVENOUS | Status: DC | PRN
  Administered 2019-07-31: 10 mg via INTRAVENOUS

## 2019-07-31 MED ORDER — MIDAZOLAM HCL 2 MG/2ML IJ SOLN
INTRAMUSCULAR | Status: AC
  Filled 2019-07-31: qty 2

## 2019-07-31 MED ORDER — LACTATED RINGERS IV SOLN
INTRAVENOUS | Status: DC
  Administered 2019-07-31: 13:00:00 via INTRAVENOUS

## 2019-07-31 MED ORDER — DEXAMETHASONE SODIUM PHOSPHATE 10 MG/ML IJ SOLN
INTRAMUSCULAR | Status: DC | PRN
  Administered 2019-07-31: 8 mg via INTRAVENOUS

## 2019-07-31 MED ORDER — ROCURONIUM BROMIDE 10 MG/ML (PF) SYRINGE
PREFILLED_SYRINGE | INTRAVENOUS | Status: AC
  Filled 2019-07-31: qty 10

## 2019-07-31 MED ORDER — OXYCODONE HCL 5 MG PO TABS
5.0000 mg | ORAL_TABLET | ORAL | Status: DC | PRN
  Administered 2019-08-01: 5 mg via ORAL
  Filled 2019-07-31: qty 2

## 2019-07-31 MED ORDER — ACETAMINOPHEN 160 MG/5ML PO SOLN: 325.0000 mg | ORAL | Status: DC | PRN

## 2019-07-31 MED ORDER — CHLORHEXIDINE GLUCONATE CLOTH 2 % EX PADS: 6.0000 | MEDICATED_PAD | Freq: Once | CUTANEOUS | Status: DC

## 2019-07-31 MED ORDER — 0.9 % SODIUM CHLORIDE (POUR BTL) OPTIME
TOPICAL | Status: DC | PRN
  Administered 2019-07-31: 1000 mL

## 2019-07-31 MED ORDER — FENTANYL CITRATE (PF) 100 MCG/2ML IJ SOLN
25.0000 ug | INTRAMUSCULAR | Status: DC | PRN
  Administered 2019-07-31 (×2): 50 ug via INTRAVENOUS

## 2019-07-31 MED ORDER — ACETAMINOPHEN 325 MG PO TABS: 650.0000 mg | ORAL_TABLET | Freq: Four times a day (QID) | ORAL | Status: DC | PRN

## 2019-07-31 MED ORDER — OXYCODONE HCL 5 MG PO TABS: 5.0000 mg | ORAL_TABLET | Freq: Once | ORAL | Status: DC | PRN

## 2019-07-31 MED ORDER — LIDOCAINE 2% (20 MG/ML) 5 ML SYRINGE
INTRAMUSCULAR | Status: DC | PRN
  Administered 2019-07-31: 1.5 mg/kg/h via INTRAVENOUS

## 2019-07-31 MED ORDER — FENTANYL CITRATE (PF) 250 MCG/5ML IJ SOLN
INTRAMUSCULAR | Status: AC
  Filled 2019-07-31: qty 5

## 2019-07-31 MED ORDER — ONDANSETRON 4 MG PO TBDP: 4.0000 mg | ORAL_TABLET | Freq: Four times a day (QID) | ORAL | Status: DC | PRN

## 2019-07-31 MED ORDER — LIDOCAINE 2% (20 MG/ML) 5 ML SYRINGE
INTRAMUSCULAR | Status: DC | PRN
  Administered 2019-07-31: 80 mg via INTRAVENOUS

## 2019-07-31 SURGICAL SUPPLY — 33 items
ADH SKN CLS APL DERMABOND .7 (GAUZE/BANDAGES/DRESSINGS) ×1
APL PRP STRL LF DISP 70% ISPRP (MISCELLANEOUS) ×1
ATTRACTOMAT 16X20 MAGNETIC DRP (DRAPES) ×3 IMPLANT
BLADE SURG 15 STRL LF DISP TIS (BLADE) ×1 IMPLANT
BLADE SURG 15 STRL SS (BLADE) ×3
CHLORAPREP W/TINT 26 (MISCELLANEOUS) ×4 IMPLANT
CLIP VESOCCLUDE MED 6/CT (CLIP) ×6 IMPLANT
CLIP VESOCCLUDE SM WIDE 6/CT (CLIP) ×6 IMPLANT
COVER SURGICAL LIGHT HANDLE (MISCELLANEOUS) ×3 IMPLANT
COVER WAND RF STERILE (DRAPES) ×3 IMPLANT
DERMABOND ADVANCED (GAUZE/BANDAGES/DRESSINGS) ×2
DERMABOND ADVANCED .7 DNX12 (GAUZE/BANDAGES/DRESSINGS) IMPLANT
DRAPE LAPAROTOMY T 98X78 PEDS (DRAPES) ×3 IMPLANT
ELECT PENCIL ROCKER SW 15FT (MISCELLANEOUS) ×3 IMPLANT
ELECT REM PT RETURN 15FT ADLT (MISCELLANEOUS) ×3 IMPLANT
GAUZE 4X4 16PLY RFD (DISPOSABLE) ×3 IMPLANT
GLOVE SURG ORTHO 8.0 STRL STRW (GLOVE) ×3 IMPLANT
GOWN STRL REUS W/TWL XL LVL3 (GOWN DISPOSABLE) ×8 IMPLANT
HEMOSTAT SURGICEL 2X4 FIBR (HEMOSTASIS) ×2 IMPLANT
ILLUMINATOR WAVEGUIDE N/F (MISCELLANEOUS) ×2 IMPLANT
KIT BASIN OR (CUSTOM PROCEDURE TRAY) ×3 IMPLANT
KIT TURNOVER KIT A (KITS) ×2 IMPLANT
NERVE STIMULATOR WAVEFORM 16S (NEUROSURGERY SUPPLIES) ×3
PACK BASIC VI WITH GOWN DISP (CUSTOM PROCEDURE TRAY) ×3 IMPLANT
SHEARS HARMONIC 9CM CVD (BLADE) ×3 IMPLANT
STIMULATOR NERVE WAVEFORM 16S (NEUROSURGERY SUPPLIES) IMPLANT
SUT MNCRL AB 4-0 PS2 18 (SUTURE) ×3 IMPLANT
SUT VIC AB 3-0 SH 18 (SUTURE) ×4 IMPLANT
SYR BULB IRRIGATION 50ML (SYRINGE) ×3 IMPLANT
TOWEL OR 17X26 10 PK STRL BLUE (TOWEL DISPOSABLE) ×3 IMPLANT
TOWEL OR NON WOVEN STRL DISP B (DISPOSABLE) ×3 IMPLANT
TUBING CONNECTING 10 (TUBING) ×2 IMPLANT
TUBING CONNECTING 10' (TUBING) ×1

## 2019-07-31 NOTE — Transfer of Care (Signed)
Immediate Anesthesia Transfer of Care Note  Patient: Tracy Montgomery  Procedure(s) Performed: LEFT THYROID LOBECTOMY, ISTHMUSECTOMY (N/A Neck)  Patient Location: PACU  Anesthesia Type:General  Level of Consciousness: awake, alert , oriented and patient cooperative  Airway & Oxygen Therapy: Patient Spontanous Breathing and Patient connected to face mask oxygen  Post-op Assessment: Report given to RN, Post -op Vital signs reviewed and stable and Patient moving all extremities  Post vital signs: Reviewed and stable  Last Vitals:  Vitals Value Taken Time  BP 143/97 07/31/19 1617  Temp    Pulse 117 07/31/19 1619  Resp 18 07/31/19 1619  SpO2 100 % 07/31/19 1619  Vitals shown include unvalidated device data.  Last Pain: There were no vitals filed for this visit.       Complications: No apparent anesthesia complications

## 2019-07-31 NOTE — Op Note (Signed)
Procedure Note  Pre-operative Diagnosis:  Enlarged thyroid, multiple thyroid nodules  Post-operative Diagnosis:  same  Surgeon:  Armandina Gemma, MD  Assistant:  none   Procedure:  Left thyroid lobectomy and isthmusectomy  Anesthesia:  General  Estimated Blood Loss:  minimal  Drains: none         Specimen: thyroid lobe to pathology  Indications:  Patient returns to discuss surgery for an enlarging thyroid mass. This is been followed in my practice since 2017. Her most recent ultrasound is from December 11, 2018. This now shows a enlarged left thyroid lobe measuring 7.5 x 3.9 x 3.8 cm. There is a dominant mass in the medial aspect of the left lobe measuring 3.9 x 2.8 x 2.8 cm. Right thyroid lobe is normal in size. Small subcentimeter nodules in the right lobe remains cystic or spongiform and if not significantly changed over the past 3 years. Most recent TSH level remains normal. Patient has never been on thyroid medication. Patient has developed some raspiness and hoarseness on occasion. She denies any dysphagia. She denies any dyspnea. She would like to proceed with surgery in the near future.  Procedure Details: Procedure was done in OR #2 at the [facility]. The patient was brought to the operating room and placed in a supine position on the operating room table. Following administration of general anesthesia, the patient was positioned and then prepped and draped in the usual aseptic fashion. After ascertaining that an adequate level of anesthesia had been achieved, a small Kocher incision was made with #15 blade. Dissection was carried through subcutaneous tissues and platysma. Hemostasis was achieved with the electrocautery. Skin flaps were elevated cephalad and caudad from the thyroid notch to the sternal notch. A self-retaining retractor was placed for exposure. Strap muscles were incised in the midline and dissection was begun on the left side. Strap muscles were reflected  laterally. The left thyroid lobe was multinodular and markedly enlarged.  It extended well cephalad as well as extending into the superior mediastinum beneath the clavicle. The lobe was gently mobilized with blunt dissection. Superior pole vessels were dissected out and divided individually between small and medium ligaclips with the harmonic scalpel. The thyroid lobe was rolled anteriorly. Branches of the inferior thyroid artery were divided between small ligaclips with the harmonic scalpel. Inferior venous tributaries were divided between ligaclips. Both the superior and inferior parathyroid glands were identified and preserved on their vascular pedicles. Due to the size of the lobe, the recurrent laryngeal nerve was not positively identified. Tissue was carefully dissected off of the capsule and swept laterally and posteriorly to avoid injury.  The ligament of Gwenlyn Found was released with the electrocautery and the gland was mobilized onto the anterior trachea. Isthmus was mobilized across the midline. There was a small pyramidal lobe present which was resected with the isthmus.  In the mid and right portion of the isthmus was a large 4 cm nodule which was completely mobilized. The thyroid parenchyma was transected at the junction of the isthmus and contralateral thyroid lobe with the harmonic scalpel. The thyroid lobe and isthmus were submitted to pathology for review.  The neck was irrigated with warm saline. Fibrillar was placed throughout the operative field. Strap muscles were approximated in the midline with interrupted 3-0 Vicryl sutures. Platysma was closed with interrupted 3-0 Vicryl sutures. Skin was closed with a running 4-0 Monocryl subcuticular suture.  Wound was washed and dried and Dermabond was applied. The patient was awakened from anesthesia and  brought to the recovery room. The patient tolerated the procedure well.   Armandina Gemma, MD [facility] Office: [phone]

## 2019-07-31 NOTE — Anesthesia Postprocedure Evaluation (Signed)
Anesthesia Post Note  Patient: Tracy Montgomery  Procedure(s) Performed: LEFT THYROID LOBECTOMY, ISTHMUSECTOMY (N/A Neck)     Patient location during evaluation: PACU Anesthesia Type: General Level of consciousness: awake and alert Pain management: pain level controlled Vital Signs Assessment: post-procedure vital signs reviewed and stable Respiratory status: spontaneous breathing, nonlabored ventilation, respiratory function stable and patient connected to nasal cannula oxygen Cardiovascular status: blood pressure returned to baseline and stable Postop Assessment: no apparent nausea or vomiting Anesthetic complications: no    Last Vitals:  Vitals:   07/31/19 1730 07/31/19 1756  BP: (!) 142/94 (!) 151/95  Pulse: (!) 106 (!) 112  Resp: 14 16  Temp: 37.1 C 36.8 C  SpO2: 93% 98%    Last Pain:  Vitals:   07/31/19 1756  TempSrc: Oral  PainSc:                  [name]

## 2019-07-31 NOTE — Anesthesia Procedure Notes (Addendum)

## 2019-07-31 NOTE — Interval H&P Note (Signed)
History and Physical Interval Note:  07/31/2019 4:11 PM  [facility]  has presented today for surgery, with the diagnosis of MULTIPLE THYROID NODULES, ENLARGED THYROID.  The various methods of treatment have been discussed with the patient and family. After consideration of risks, benefits and other options for treatment, the patient has consented to    Procedure(s): LEFT THYROID LOBECTOMY, ISTHMUSECTOMY (N/A) as a surgical intervention.    The patient's history has been reviewed, patient examined, no change in status, stable for surgery.  I have reviewed the patient's chart and labs.  Questions were answered to the patient's satisfaction.    Armandina Gemma, [place] [facility] Office: [place]

## 2019-08-01 ENCOUNTER — Encounter (HOSPITAL_COMMUNITY): Payer: Self-pay | Admitting: Surgery

## 2019-08-01 DIAGNOSIS — E042 Nontoxic multinodular goiter: Secondary | ICD-10-CM | POA: Diagnosis not present

## 2019-08-01 LAB — BASIC METABOLIC PANEL
Anion gap: 8 (ref 5–15)
BUN: 9 mg/dL (ref 6–20)
CO2: 23 mmol/L (ref 22–32)
Calcium: 8.8 mg/dL — ABNORMAL LOW (ref 8.9–10.3)
Chloride: 104 mmol/L (ref 98–111)
Creatinine, Ser: 0.52 mg/dL (ref 0.44–1.00)
GFR calc Af Amer: >60 mL/min (ref >60)
GFR calc non Af Amer: >60 mL/min (ref >60)
Glucose, Bld: 128 mg/dL — ABNORMAL HIGH (ref 70–99)
Potassium: 3.9 mmol/L (ref 3.5–5.1)
Sodium: 135 mmol/L (ref 135–145)

## 2019-08-01 MED ORDER — OXYCODONE HCL 5 MG PO TABS: 5.0000 mg | ORAL_TABLET | ORAL | 0 refills | Status: DC | PRN

## 2019-08-01 MED ORDER — PHENOL 1.4 % MT LIQD
1.0000 | OROMUCOSAL | Status: DC | PRN
  Administered 2019-08-01: 1 via OROMUCOSAL
  Filled 2019-08-01: qty 177

## 2019-08-01 NOTE — Discharge Summary (Signed)
Physician Discharge Summary [facility]  Patient ID: Tracy Montgomery MRN: [id number] DOB/AGE: 11-30-1970 48 y.o.  Admit date: 07/31/2019 Discharge date: 08/01/2019  Admission Diagnoses:  Multiple thyroid nodules, enlarged thyroid  Discharge Diagnoses:  Principal Problem:   Multiple thyroid nodules Active Problems:   Enlarged thyroid   Discharged Condition: good  Hospital Course: Patient was admitted for observation following thyroid surgery.  Post op course was uncomplicated.  Pain was well controlled.  Tolerated diet.  Post op calcium level on morning following surgery was 8.8 mg/dl.  Patient was prepared for discharge home on POD#1.  Consults: None  Treatments: surgery: left thyroid lobectomy and isthmusectomy  Discharge Exam: Blood pressure 127/79, pulse 78, temperature 98.5 F (36.9 C), temperature source Oral, resp. rate 20, height 5\' 2"  (1.575 m), weight 81.2 kg, last menstrual period 07/08/2019, SpO2 98 %. HEENT - clear Neck - wound dry and intact; mild STS; slight hoarseness, no stridor Chest - clear bilaterally Cor - RRR  Disposition: Home  Discharge Instructions    Diet - low sodium heart healthy   Complete by: As directed    Discharge instructions   Complete by: As directed    [facility]  THYROID & PARATHYROID SURGERY:  POST-OP INSTRUCTIONS  Always review your discharge instruction sheet from the facility where your surgery was performed.  A prescription for pain medication may be given to you upon discharge.  Take your pain medication as prescribed.  If narcotic pain medicine is not needed, then you may take acetaminophen (Tylenol) or ibuprofen (Advil) as needed.  Take your usually prescribed medications unless otherwise directed.  If you need a refill on your pain medication, please contact our office during regular business hours.  Prescriptions cannot be processed by our office after 5 pm or on weekends.   Start with a light diet upon arrival home, such as soup and crackers or toast.  Be sure to drink plenty of fluids daily.  Resume your normal diet the day after surgery.  Most patients will experience some swelling and bruising on the chest and neck area.  Ice packs will help.  Swelling and bruising can take several days to resolve.   It is common to experience some constipation after surgery.  Increasing fluid intake and taking a stool softener (Colace) will usually help or prevent this problem.  A mild laxative (Milk of Magnesia or Miralax) should be taken according to package directions if there has been no bowel movement after 48 hours.  You have steri-strips and a gauze dressing over your incision.  You may remove the gauze bandage on the second day after surgery, and you may shower at that time.  Leave your steri-strips (small skin tapes) in place directly over the incision.  These strips should remain on the skin for 5-7 days and then be removed.  You may get them wet in the shower and pat them dry.  You may resume regular (light) daily activities beginning the next day (such as daily self-care, walking, climbing stairs) gradually increasing activities as tolerated.  You may have sexual intercourse when it is comfortable.  Refrain from any heavy lifting or straining until approved by your doctor.  You may drive when you no longer are taking prescription pain medication, you can comfortably wear a seatbelt, and you can safely maneuver your car and apply brakes.  You should see your doctor in the office for a follow-up appointment approximately three weeks after your  surgery.  Make sure that you call for this appointment within a day or two after you arrive home to insure a convenient appointment time.  WHEN TO CALL YOUR DOCTOR: -- Fever greater than 101.5 -- Inability to urinate -- Nausea and/or vomiting - persistent -- Extreme swelling or bruising -- Continued bleeding from incision --  Increased pain, redness, or drainage from the incision -- Difficulty swallowing or breathing -- Muscle cramping or spasms -- Numbness or tingling in hands or around lips  The clinic staff is available to answer your questions during regular business hours.  Please don't hesitate to call and ask to speak to one of the nurses if you have concerns.  Armandina Gemma, MD [facility] Office: [phone]   Increase activity slowly   Complete by: As directed    No dressing needed   Complete by: As directed      Allergies as of 08/01/2019      Reactions   Ceftin [cefuroxime Axetil] Swelling   Facial Swelling      Medication List    TAKE these medications   multivitamin with minerals Tabs tablet Take 1 tablet by mouth daily.   oxyCODONE 5 MG immediate release tablet Commonly known as: Roxicodone Take 0.5-1 tablets (2.5-5 mg total) by mouth every 6 (six) hours as needed for severe pain. What changed: Another medication with the same name was added. Make sure you understand how and when to take each.   oxyCODONE 5 MG immediate release tablet Commonly known as: Oxy IR/ROXICODONE Take 1-2 tablets (5-10 mg total) by mouth every 4 (four) hours as needed for moderate pain. What changed: You were already taking a medication with the same name, and this prescription was added. Make sure you understand how and when to take each.   oxyCODONE 5 MG immediate release tablet Commonly known as: Oxy IR/ROXICODONE Take 1-2 tablets (5-10 mg total) by mouth every 4 (four) hours as needed for moderate pain. What changed: You were already taking a medication with the same name, and this prescription was added. Make sure you understand how and when to take each.      Follow-up Information    Armandina Gemma, MD. Schedule an appointment as soon as possible for a visit in 3 week(s).   Specialty: General Surgery Contact information: [address] [place] [address]  [phone]           Earnstine Regal, MD, [facility] Office: [phone]   Signed: Armandina Gemma 08/01/2019, 8:02 AM

## 2019-08-01 NOTE — Progress Notes (Signed)
Discharge instructions given to patient. Patient had no questions. Writer or NT will wheel patient out once family comes to pick her up

## 2019-08-02 LAB — SURGICAL PATHOLOGY

## 2019-08-30 ENCOUNTER — Other Ambulatory Visit (HOSPITAL_COMMUNITY)
Admission: RE | Admit: 2019-08-30 | Discharge: 2019-08-30 | Disposition: A | Discharge status: Home / Self Care | Payer: No Typology Code available for payment source | Source: Ambulatory Visit | Attending: Surgery | Admitting: Surgery

## 2019-08-30 DIAGNOSIS — Z9009 Acquired absence of other part of head and neck: Secondary | ICD-10-CM | POA: Insufficient documentation

## 2019-08-30 LAB — TSH: TSH: 2.837 u[IU]/mL (ref 0.350–4.500)

## 2019-12-13 MED FILL — LEVOTHYROXINE SODIUM 25 MCG: 25 | 30 days supply | Qty: 30 | Fill #0

## 2020-01-16 MED FILL — LEVOTHYROXINE SODIUM 25 MCG: 25 | 30 days supply | Qty: 30 | Fill #1

## 2020-01-22 ENCOUNTER — Other Ambulatory Visit (HOSPITAL_COMMUNITY): Payer: Self-pay | Admitting: Surgery

## 2020-02-04 MED FILL — LEVOTHYROXINE 50 MCG TABLET: 50 | 30 days supply | Qty: 30 | Fill #0

## 2020-02-29 MED FILL — LEVOTHYROXINE 50 MCG TABLET: 50 | 30 days supply | Qty: 30 | Fill #1

## 2020-04-10 MED FILL — LEVOTHYROXINE 50 MCG TABLET: 50 | 30 days supply | Qty: 30 | Fill #2

## 2020-05-20 MED FILL — LEVOTHYROXINE 50 MCG TABLET: 50 | 30 days supply | Qty: 30 | Fill #3

## 2020-06-25 MED FILL — LEVOTHYROXINE 50 MCG TABLET: 50 | 30 days supply | Qty: 30 | Fill #0

## 2020-08-01 MED FILL — LEVOTHYROXINE 50 MCG TABLET: 50 | 30 days supply | Qty: 30 | Fill #1

## 2020-08-18 ENCOUNTER — Encounter (HOSPITAL_COMMUNITY): Payer: Self-pay | Admitting: Emergency Medicine

## 2020-08-18 ENCOUNTER — Other Ambulatory Visit: Payer: Self-pay

## 2020-08-18 ENCOUNTER — Ambulatory Visit (HOSPITAL_COMMUNITY)
Admission: EM | Admit: 2020-08-18 | Discharge: 2020-08-18 | Disposition: A | Discharge status: Home / Self Care | Payer: No Typology Code available for payment source | Attending: Family Medicine | Admitting: Family Medicine

## 2020-08-18 ENCOUNTER — Ambulatory Visit (HOSPITAL_COMMUNITY)
Admission: RE | Admit: 2020-08-18 | Discharge: 2020-08-18 | Disposition: A | Discharge status: Home / Self Care | Payer: No Typology Code available for payment source | Source: Ambulatory Visit | Attending: Family Medicine | Admitting: Family Medicine

## 2020-08-18 DIAGNOSIS — S8991XA Unspecified injury of right lower leg, initial encounter: Secondary | ICD-10-CM | POA: Insufficient documentation

## 2020-08-18 DIAGNOSIS — M79661 Pain in right lower leg: Secondary | ICD-10-CM

## 2020-08-18 DIAGNOSIS — R609 Edema, unspecified: Secondary | ICD-10-CM

## 2020-08-18 DIAGNOSIS — M7989 Other specified soft tissue disorders: Secondary | ICD-10-CM

## 2020-08-18 DIAGNOSIS — M79604 Pain in right leg: Secondary | ICD-10-CM | POA: Diagnosis not present

## 2020-08-18 DIAGNOSIS — M79609 Pain in unspecified limb: Secondary | ICD-10-CM | POA: Diagnosis not present

## 2020-08-18 DIAGNOSIS — R600 Localized edema: Secondary | ICD-10-CM | POA: Insufficient documentation

## 2020-08-18 DIAGNOSIS — X58XXXA Exposure to other specified factors, initial encounter: Secondary | ICD-10-CM | POA: Insufficient documentation

## 2020-08-18 HISTORY — DX: Disorder of thyroid, unspecified: E07.9

## 2020-08-18 NOTE — ED Notes (Signed)
Scheduled for today : 4:00pm [address]  [facility] bldg

## 2020-08-18 NOTE — ED Provider Notes (Signed)
[facility]    CSN: [id number] Arrival date & time: 08/18/20  1328      History   Chief Complaint Chief Complaint  Patient presents with   Leg Pain    HPI Jesusita OkaHolly H Norment is a 49 y.o. female.   HPI  Patient states that she twisted her right knee on 08/09/2020.  She said swelling and pain around the knee joint.  Some limited movement.  She states that she has been limping.  She states that since yesterday she has been having increased pain in her calf.  Swelling in the calf and in the ankle.  Is worried about a blood clot. This patient is an [employer] at [employer].  She states that she discussed this with her primary care but they were unable to see her until tomorrow.  Past Medical History:  Diagnosis Date   Thyroid disease     Patient Active Problem List   Diagnosis Date Noted   Enlarged thyroid 07/22/2019   Multiple thyroid nodules 07/22/2019    Past Surgical History:  Procedure Laterality Date   FOOT SURGERY  1978   3 bening cyst removed   THYROID LOBECTOMY N/A 07/31/2019   Procedure: LEFT THYROID LOBECTOMY, ISTHMUSECTOMY;  Surgeon: Darnell LevelGerkin, Todd, MD;  Location: [facility];  Service: General;  Laterality: N/A;    OB History   No obstetric history on file.      Home Medications    Prior to Admission medications   Medication Sig Start Date End Date Taking? Authorizing Provider  NON FORMULARY levothyroxine   Yes [provider]  Multiple Vitamin (MULTIVITAMIN WITH MINERALS) TABS tablet Take 1 tablet by mouth daily.    [provider]    Family History History reviewed. No pertinent family history.  Social History Social History   Tobacco Use   Smoking status: Never Smoker   Smokeless tobacco: Never Used  Substance Use Topics   Alcohol use: Never   Drug use: Never     Allergies   Ceftin [cefuroxime axetil]   Review of Systems Review of Systems See HPI  Physical Exam Triage Vital Signs ED Triage Vitals    Enc Vitals Group     BP 08/18/20 1349 (!) 162/99     Pulse Rate 08/18/20 1349 93     Resp 08/18/20 1349 18     Temp 08/18/20 1349 98.1 F (36.7 C)     Temp Source 08/18/20 1349 Oral     SpO2 08/18/20 1349 100 %     Weight --      Height --      Head Circumference --      Peak Flow --      Pain Score 08/18/20 1345 5     Pain Loc --      Pain Edu? --      Excl. in GC? --       Physical Exam Constitutional:      General: She is not in acute distress.    Appearance: She is well-developed and normal weight.  HENT:     Head: Normocephalic and atraumatic.     Mouth/Throat:     Comments: Mask in place Eyes:     Conjunctiva/sclera: Conjunctivae normal.     Pupils: Pupils are equal, round, and reactive to light.  Cardiovascular:     Rate and Rhythm: Normal rate.  Pulmonary:     Effort: Pulmonary effort is normal. No respiratory distress.  Abdominal:  General: There is no distension.     Palpations: Abdomen is soft.  Musculoskeletal:        General: Normal range of motion.     Cervical back: Normal range of motion.     Comments: Right knee has slight warmth.  Can flex to 90 degrees and extend fully.  No instability.  Mild joint line tenderness.  There is definite tenderness into the deep calf.  No pain with flexion of ankle.  There is pitting edema to the ankle.  Increased circumference of right leg versus left.  Skin:    General: Skin is warm and dry.  Neurological:     Mental Status: She is alert.     Gait: Gait abnormal.  Psychiatric:        Behavior: Behavior normal.      UC Treatments / Results  Labs (all labs ordered are listed, but only abnormal results are displayed) Labs Reviewed - No data to display  EKG   Radiology VAS [place] LOWER EXTREMITY VENOUS (DVT) (ONLY [facility])  Result Date: 08/18/2020  Lower Venous DVT Study Indications: Pain, Swelling, and Edema.  Risk Factors: Trauma Right knee pain last week. Performing Technologist: Hardie Lora RVT   Examination Guidelines: A complete evaluation includes B-mode imaging, spectral Doppler, color Doppler, and power Doppler as needed of all accessible portions of each vessel. Bilateral testing is considered an integral part of a complete examination. Limited examinations for reoccurring indications may be performed as noted. The reflux portion of the exam is performed with the patient in reverse Trendelenburg.  +---------+---------------+---------+-----------+----------+--------------+  RIGHT     Compressibility Phasicity Spontaneity Properties Thrombus Aging  +---------+---------------+---------+-----------+----------+--------------+  CFV       Full            Yes       Yes                                    +---------+---------------+---------+-----------+----------+--------------+  SFJ       Full            Yes       Yes                                    +---------+---------------+---------+-----------+----------+--------------+  FV Prox   Full            Yes       Yes                                    +---------+---------------+---------+-----------+----------+--------------+  FV Mid    Full            Yes       Yes                                    +---------+---------------+---------+-----------+----------+--------------+  FV Distal Full            Yes       Yes                                    +---------+---------------+---------+-----------+----------+--------------+  PFV  Full            Yes       Yes                                    +---------+---------------+---------+-----------+----------+--------------+  POP       Full            Yes       Yes                                    +---------+---------------+---------+-----------+----------+--------------+  PTV       Full                                                             +---------+---------------+---------+-----------+----------+--------------+  PERO      Full                                                              +---------+---------------+---------+-----------+----------+--------------+  GSV       Full            Yes       Yes                                    +---------+---------------+---------+-----------+----------+--------------+  SSV       Full            Yes       Yes                                    +---------+---------------+---------+-----------+----------+--------------+ Well defined, slightly hypoechoic structure seen in symptomatic area of proximal calf, consistent with muscle tear or hematoma. Structure measures 2.1 x 0.9 x 4.0 cm.  +----+---------------+---------+-----------+----------+--------------+  LEFT Compressibility Phasicity Spontaneity Properties Thrombus Aging  +----+---------------+---------+-----------+----------+--------------+  CFV  Full            Yes       Yes                                    +----+---------------+---------+-----------+----------+--------------+     Summary: RIGHT: - There is no evidence of deep vein thrombosis in the lower extremity. - There is no evidence of superficial venous thrombosis.  - No cystic structure found in the popliteal fossa. - Structure seen at symptomatic area of calf consistent with muscle tear.  LEFT: - No evidence of deep vein thrombosis in the lower extremity. No indirect evidence of obstruction proximal to the inguinal ligament.  *See table(s) above for measurements and observations.    Preliminary     Procedures Procedures (including critical care time)  Medications Ordered in UC Medications - No data to display  Initial Impression / Assessment and Plan / UC  Course  I have reviewed the triage vital signs and the nursing notes.  Pertinent labs & imaging results that were available during my care of the patient were reviewed by me and considered in my medical decision making (see chart for details).     ADDENDUM:  CALLED PATIENT WITH THE TEST RESULTS.  Recommended Ice/heat, ibuprofen, gentle stretching, followup with PCP Final  Clinical Impressions(s) / UC Diagnoses   Final diagnoses:  Right leg pain  Right calf pain     Discharge Instructions     Need ultrasound today I will call with result and advice    ED Prescriptions    None     PDMP not reviewed this encounter.   Eustace Moore, MD 08/18/20 1728

## 2020-08-18 NOTE — ED Notes (Signed)
Spoke to dr hagler about patient, will see patient here

## 2020-08-18 NOTE — Discharge Instructions (Addendum)
Need ultrasound today I will call with result and advice

## 2020-08-18 NOTE — ED Triage Notes (Signed)
10/30 reports twisting right knee.  Had swelling and pain in right knee.  Since then, knee has made improvement, but is having pain that radiates down right calf.  Calf is described as feeling tight.    Patient's pcp unable to see her today.  Concerned for blood clot

## 2020-09-03 MED FILL — LEVOTHYROXINE 50 MCG TABLET: 50 | 30 days supply | Qty: 30 | Fill #2

## 2020-10-01 ENCOUNTER — Other Ambulatory Visit (HOSPITAL_COMMUNITY): Payer: Self-pay | Admitting: Surgery

## 2020-10-01 MED FILL — LEVOTHYROXINE 50 MCG TABLET: 50 | 30 days supply | Qty: 30 | Fill #0

## 2020-11-07 MED FILL — LEVOTHYROXINE 50 MCG TABLET: 50 | 30 days supply | Qty: 30 | Fill #1

## 2020-11-11 IMAGING — US US THYROID
1 series · 13 of 25 positions shown · non-contrast
Comparison: Prior thyroid ultrasound 10/25/2017

CLINICAL DATA: Goiter. 47-year-old female with a history of
multinodular goiter. She previously underwent biopsy of the left
isthmic nodule in 1573. The pathologic diagnosis was reportedly
benign.

EXAM:
THYROID ULTRASOUND
TECHNIQUE: Ultrasound examination of the thyroid gland and adjacent soft
tissues was performed.

[Series 1: us thyroid · 0.07mm/px · 13 of 61 slices shown]
[im 1/61]
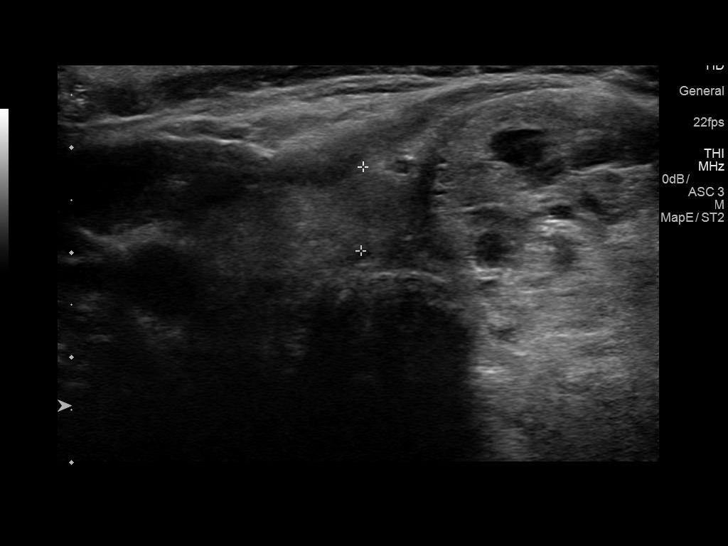
[im 6/61]
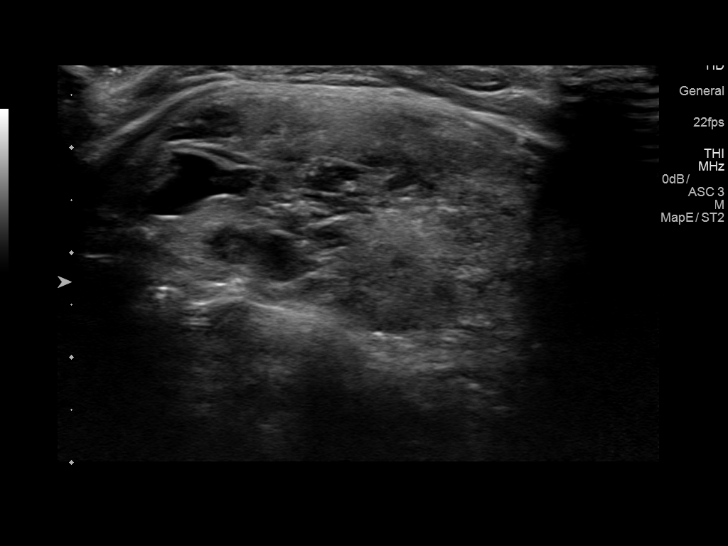
[im 11/61]
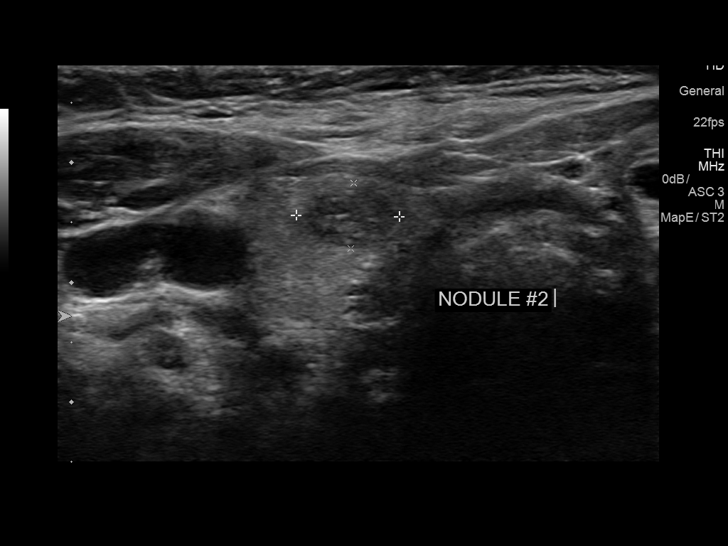
[im 16/61]
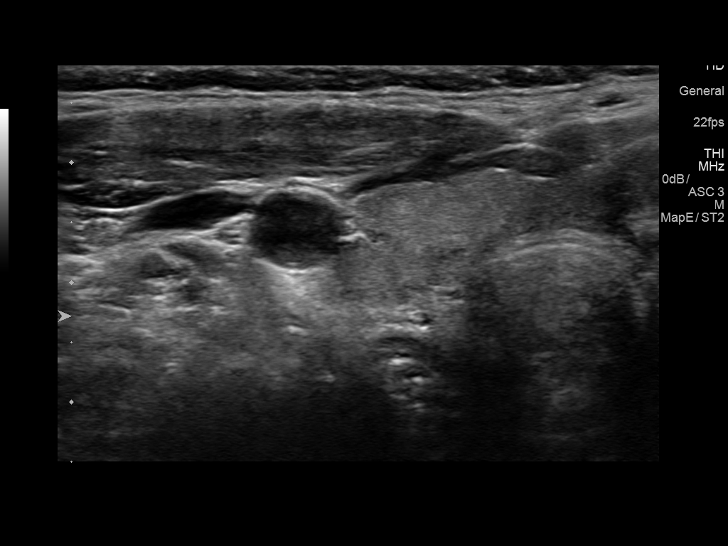
[im 21/61]
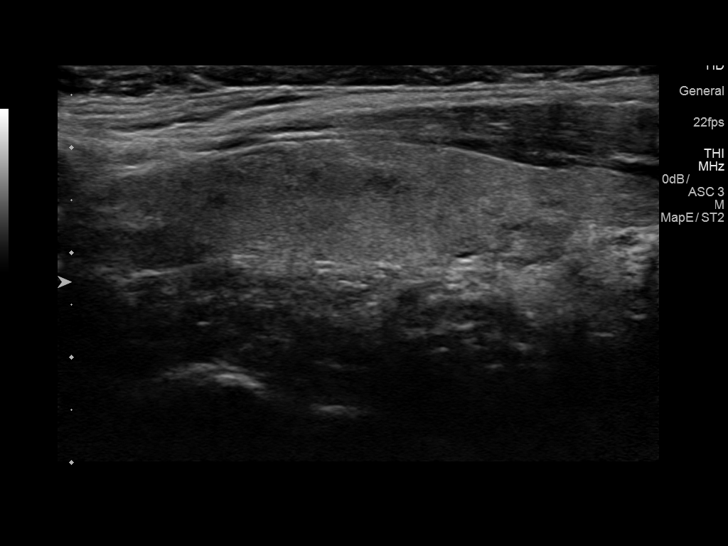
[im 26/61]
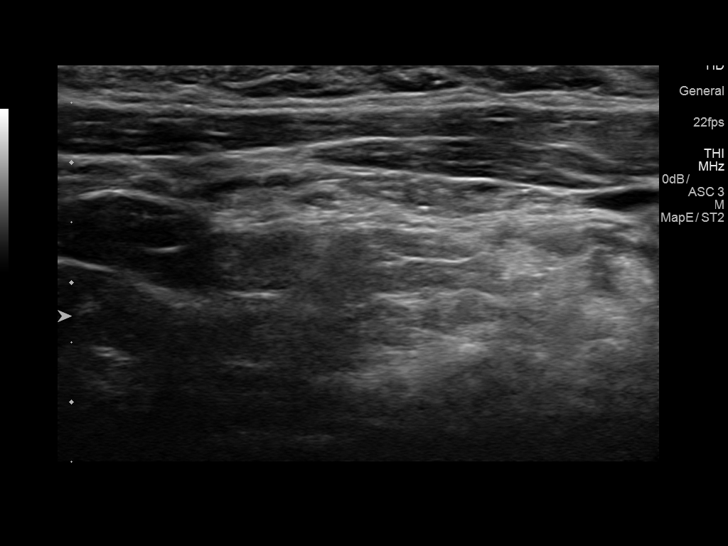
[im 31/61]
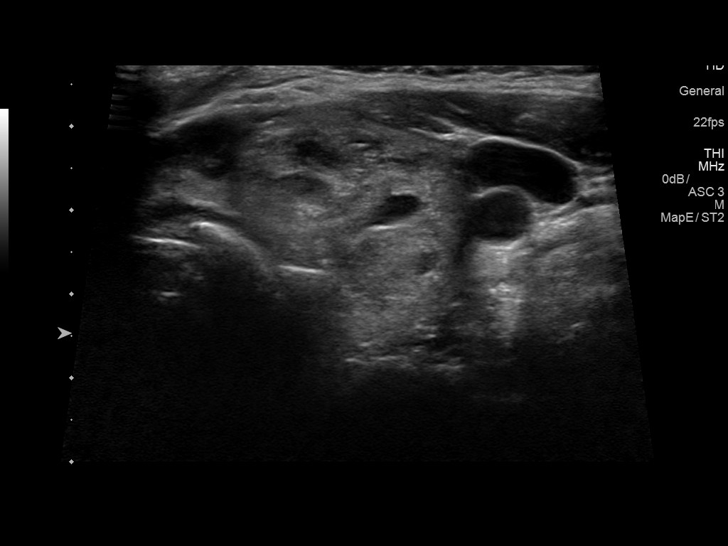
[im 36/61]
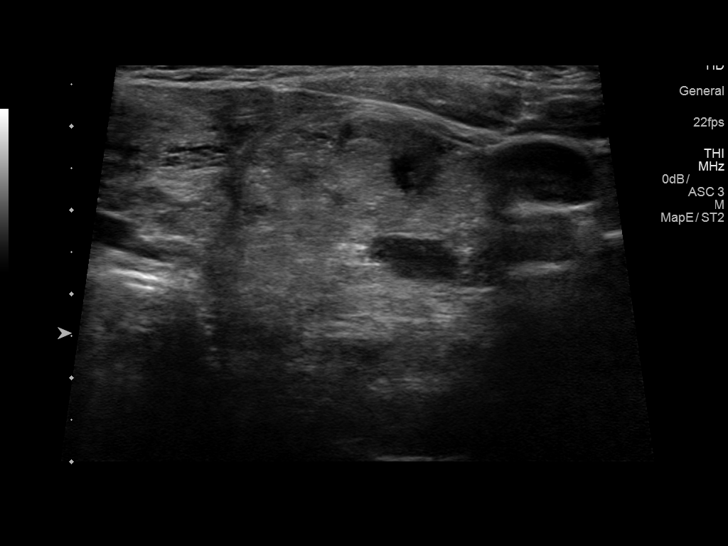
[im 41/61]
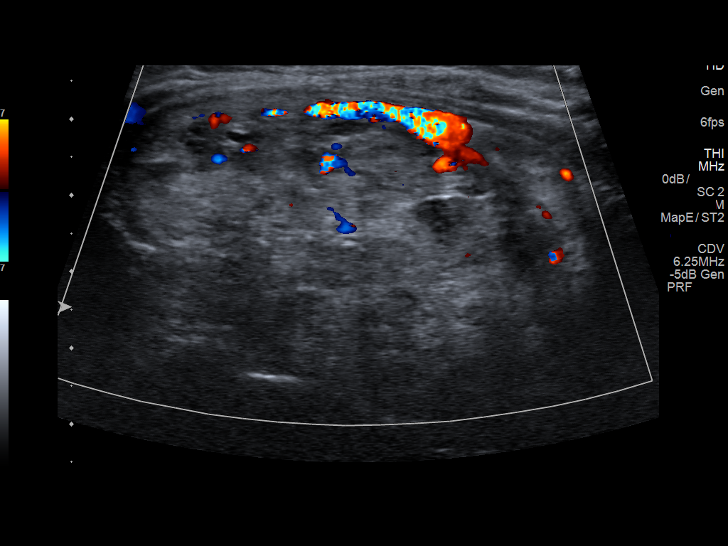
[im 46/61]
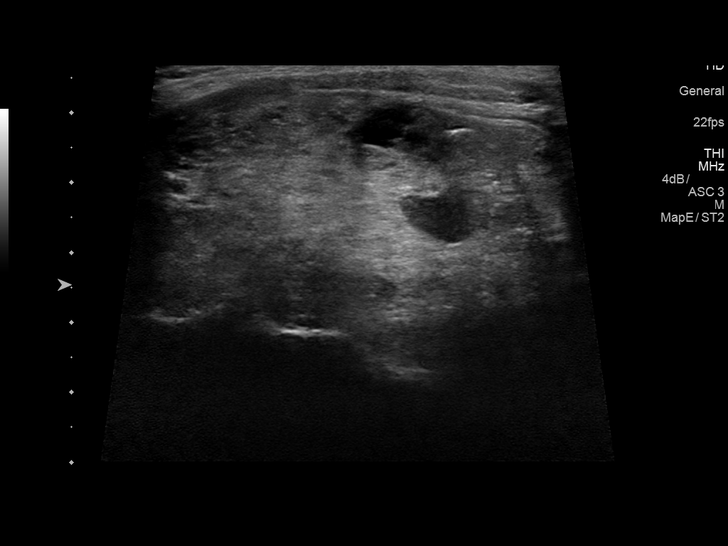
[im 51/61]
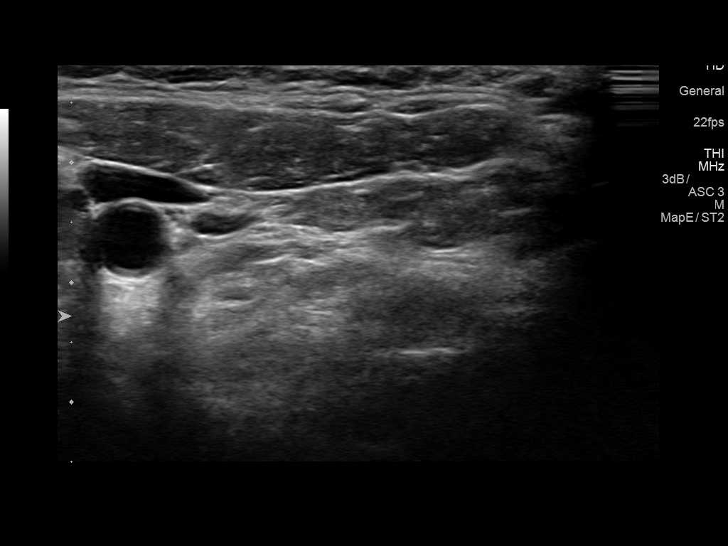
[im 56/61]
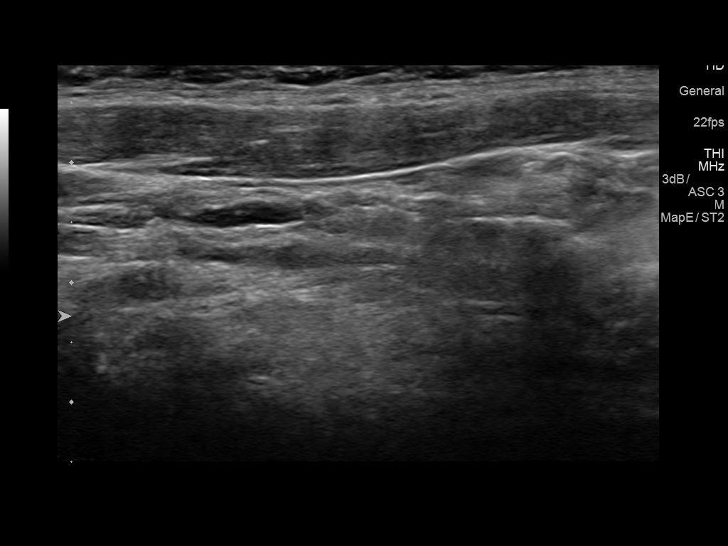
[im 61/61]
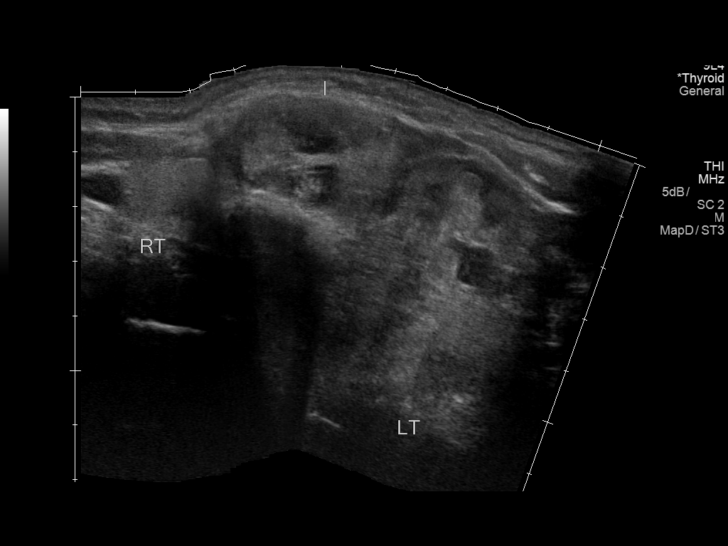

[13 of 25 positions shown; findings below may reference images not displayed]

FINDINGS: Parenchymal Echotexture: Markedly heterogenous

Isthmus: 0.8 cm

Right lobe: 5.6 x 1.2 x 1.7 cm

Left lobe: 7.5 x 3.9 x 3.8 cm

_________________________________________________________

Estimated total number of nodules >/= 1 cm: 1

Number of spongiform nodules >/=  2 cm not described below (TR1): 0

Number of mixed cystic and solid nodules >/= 1.5 cm not described
below (TR2): 0

_________________________________________________________

Previously biopsied mass in the left aspect of the thyroid isthmus
measures approximately 3.9 x 2.8 x 2.8 cm. The rim mass remains
predominantly solid and isoechoic with internal areas of cystic
degeneration. Incidental note is made of a tiny subcentimeter nodule
within the right mid gland. No new or suspicious masses are
identified.
IMPRESSION: Slow progressive enlargement of the previously biopsied mass in the
left aspect of the thyroid isthmus. Recommend correlation with prior
biopsy results.

No new or suspicious thyroid nodules identified.

The above is in keeping with the ACR TI-RADS recommendations - [HOSPITAL] 1573;[DATE].

## 2020-12-16 MED FILL — LEVOTHYROXINE 50 MCG TABLET: 50 | 30 days supply | Qty: 30 | Fill #2

## 2020-12-19 ENCOUNTER — Other Ambulatory Visit: Payer: Self-pay

## 2020-12-19 ENCOUNTER — Ambulatory Visit: Payer: No Typology Code available for payment source | Admitting: Sports Medicine

## 2020-12-19 ENCOUNTER — Ambulatory Visit (INDEPENDENT_AMBULATORY_CARE_PROVIDER_SITE_OTHER): Payer: No Typology Code available for payment source

## 2020-12-19 ENCOUNTER — Encounter: Payer: Self-pay | Admitting: Sports Medicine

## 2020-12-19 DIAGNOSIS — M79671 Pain in right foot: Secondary | ICD-10-CM | POA: Diagnosis not present

## 2020-12-19 DIAGNOSIS — M722 Plantar fascial fibromatosis: Secondary | ICD-10-CM

## 2020-12-19 DIAGNOSIS — M216X1 Other acquired deformities of right foot: Secondary | ICD-10-CM

## 2020-12-19 MED ORDER — TRIAMCINOLONE ACETONIDE 10 MG/ML IJ SUSP: 10.0000 mg | Freq: Once | INTRAMUSCULAR | Status: DC

## 2020-12-19 MED ORDER — TRIAMCINOLONE ACETONIDE 10 MG/ML IJ SUSP
10.0000 mg | Freq: Once | INTRAMUSCULAR | Status: AC
  Administered 2020-12-19: 10 mg

## 2020-12-19 NOTE — Progress Notes (Signed)
Subjective: Tracy Montgomery is a 50 y.o. female patient presents to office with complaint of moderate heel pain on the right. Patient admits to post static dyskinesia for 5 months in duration. Patient has treated this problem with stretching and changing shoes with no relief. Denies any other pedal complaints.   Review of Systems  All other systems reviewed and are negative.  Patient Active Problem List   Diagnosis Date Noted  . Enlarged thyroid 07/22/2019  . Multiple thyroid nodules 07/22/2019  . Degeneration of lumbar intervertebral disc 09/15/2018  . Lumbosacral radiculitis 09/15/2018  . Low back pain 09/13/2018    Current Outpatient Medications on File Prior to Visit  Medication Sig Dispense Refill  . levothyroxine (SYNTHROID) 50 MCG tablet levothyroxine 50 mcg tablet  TAKE 1 TABLET BY MOUTH DAILY     No current facility-administered medications on file prior to visit.    Allergies  Allergen Reactions  . Ceftin [Cefuroxime Axetil] Swelling    Facial Swelling    Objective: Physical Exam General: The patient is alert and oriented x3 in no acute distress.  Dermatology: Skin is warm, dry and supple bilateral lower extremities. Nails 1-10 are normal. There is no erythema, edema, no eccymosis, no open lesions present. Integument is otherwise unremarkable.  Vascular: Dorsalis Pedis pulse and Posterior Tibial pulse are 2/4 bilateral. Capillary fill time is immediate to all digits.  Neurological: Grossly intact to light touch bilateral.  Musculoskeletal: Tenderness to palpation at the medial calcaneal tubercale and through the insertion of the plantar fascia on the right foot. No pain with compression of calcaneus bilateral. No pain with tuning fork to calcaneus bilateral. No pain with calf compression bilateral. There is decreased Ankle joint range of motion bilateral. All other joints range of motion within normal limits bilateral. Strength 5/5 in all groups bilateral.   Gait:  Unassisted, Antalgic avoid weight on right heel  Xray, Right: Normal osseous mineralization. Joint spaces preserved. No fracture/dislocation/boney destruction. No Calcaneal spur present with mild thickening of plantar fascia. No other soft tissue abnormalities or radiopaque foreign bodies.   Assessment and Plan: Problem List Items Addressed This Visit   None   Visit Diagnoses    Pain of right heel    -  Primary   Relevant Medications   triamcinolone acetonide (KENALOG) 10 MG/ML injection 10 mg (Completed) (Start on 12/19/2020  5:00 PM)   Other Relevant Orders   DG Foot Complete Right   Plantar fasciitis of right foot       Relevant Medications   triamcinolone acetonide (KENALOG) 10 MG/ML injection 10 mg (Completed) (Start on 12/19/2020  5:00 PM)   Acquired equinus deformity of right foot         -Complete examination performed.  -Xrays reviewed -Discussed with patient in detail the condition of plantar fasciitis, how this occurs and general treatment options. Explained both conservative and surgical treatments.  -After oral consent and aseptic prep, injected a mixture containing 1 ml of 2%  plain lidocaine, 1 ml 0.5% plain marcaine, 0.5 ml of kenalog 10 and 0.5 ml of dexamethasone phosphate into right heel. Post-injection care discussed with patient.  -Recommended good supportive shoes  -Advised patient to use night splint which she has at home, that she borrowed from her husband -Explained and dispensed to patient daily stretching exercises. -Recommend patient to ice affected area 1-2x daily. -Patient to return to office in 4 weeks for follow up or sooner if problems or questions arise.  Asencion Islam, DPM

## 2020-12-19 NOTE — Patient Instructions (Signed)
Plantar Fasciitis Rehab Ask your health care provider which exercises are safe for you. Do exercises exactly as told by your health care provider and adjust them as directed. It is normal to feel mild stretching, pulling, tightness, or discomfort as you do these exercises. Stop right away if you feel sudden pain or your pain gets worse. Do not begin these exercises until told by your health care provider. Stretching and range-of-motion exercises These exercises warm up your muscles and joints and improve the movement and flexibility of your foot. These exercises also help to relieve pain. Plantar fascia stretch 1. Sit with your left / right leg crossed over your opposite knee. 2. Hold your heel with one hand with that thumb near your arch. With your other hand, hold your toes and gently pull them back toward the top of your foot. You should feel a stretch on the base (bottom) of your toes, or the bottom of your foot (plantar fascia), or both. 3. Hold this stretch for__________ seconds. 4. Slowly release your toes and return to the starting position. Repeat __________ times. Complete this exercise __________ times a day.   Gastrocnemius stretch, standing This exercise is also called a calf (gastroc) stretch. It stretches the muscles in the back of the upper calf. 1. Stand with your hands against a wall. 2. Extend your left / right leg behind you, and bend your front knee slightly. 3. Keeping your heels on the floor, your toes facing forward, and your back knee straight, shift your weight toward the wall. Do not arch your back. You should feel a gentle stretch in your upper calf. 4. Hold this position for __________ seconds. Repeat __________ times. Complete this exercise __________ times a day.   Soleus stretch, standing This exercise is also called a calf (soleus) stretch. It stretches the muscles in the back of the lower calf. 1. Stand with your hands against a wall. 2. Extend your left / right  leg behind you, and bend your front knee slightly. 3. Keeping your heels on the floor and your toes facing forward, bend your back knee and shift your weight slightly over your back leg. You should feel a gentle stretch deep in your lower calf. 4. Hold this position for __________ seconds. Repeat __________ times. Complete this exercise __________ times a day. Gastroc and soleus stretch, standing step This exercise stretches the muscles in the back of the lower leg. These muscles are in the upper calf (gastrocnemius) and the lower calf (soleus). 1. Stand with the ball of your left / right foot on the front of a step. The ball of your foot is on the walking surface, right under your toes. 2. Keep your other foot firmly on the same step. 3. Hold on to the wall or a railing for balance. 4. Slowly lift your other foot, allowing your body weight to press your heel down over the edge of the front of the step. Keep knee straight and unbent. You should feel a stretch in your calf. 5. Hold this position for __________ seconds. 6. Return both feet to the step. 7. Repeat this exercise with a slight bend in your left / right knee. Repeat __________ times with your left / right knee straight and __________ times with your left / right knee bent. Complete this exercise __________ times a day. Balance exercise This exercise builds your balance and strength control of your arch to help take pressure off your plantar fascia. Single leg stand If this exercise   is too easy, you can try it with your eyes closed or while standing on a pillow. 1. Without shoes, stand near a railing or in a doorway. You may hold on to the railing or door frame as needed. 2. Stand on your left / right foot. Keep your big toe down on the floor and lift the arch of your foot. You should feel a stretch across the bottom of your foot and your arch. Do not let your foot roll inward. 3. Hold this position for __________ seconds. Repeat  __________ times. Complete this exercise __________ times a day. This information is not intended to replace advice given to you by your health care provider. Make sure you discuss any questions you have with your health care provider. Document Revised: 07/10/2020 Document Reviewed: 07/10/2020 Elsevier Patient Education  2021 [employer]  

## 2021-01-21 ENCOUNTER — Ambulatory Visit: Payer: No Typology Code available for payment source | Admitting: Sports Medicine

## 2023-11-02 ENCOUNTER — Encounter: Payer: Self-pay | Admitting: Gastroenterology

## 2024-01-02 ENCOUNTER — Ambulatory Visit (AMBULATORY_SURGERY_CENTER): Payer: 59

## 2024-01-02 VITALS — Ht 62.0 in | Wt 200.0 lb

## 2024-01-02 DIAGNOSIS — Z1211 Encounter for screening for malignant neoplasm of colon: Secondary | ICD-10-CM

## 2024-01-02 MED ORDER — NA SULFATE-K SULFATE-MG SULF 17.5-3.13-1.6 GM/177ML PO SOLN: 1.0000 | Freq: Once | ORAL | 0 refills | Status: AC

## 2024-01-02 NOTE — Progress Notes (Signed)

## 2024-01-13 ENCOUNTER — Encounter: Payer: Self-pay | Admitting: Gastroenterology

## 2024-01-16 ENCOUNTER — Encounter: Payer: Self-pay | Admitting: Gastroenterology

## 2024-01-16 ENCOUNTER — Ambulatory Visit (AMBULATORY_SURGERY_CENTER): Payer: 59 | Admitting: Gastroenterology

## 2024-01-16 VITALS — BP 119/78 | HR 74 | Temp 98.4°F | Resp 10 | Ht 62.0 in | Wt 200.0 lb

## 2024-01-16 DIAGNOSIS — K635 Polyp of colon: Secondary | ICD-10-CM

## 2024-01-16 DIAGNOSIS — K5730 Diverticulosis of large intestine without perforation or abscess without bleeding: Secondary | ICD-10-CM | POA: Diagnosis not present

## 2024-01-16 DIAGNOSIS — Z1211 Encounter for screening for malignant neoplasm of colon: Secondary | ICD-10-CM

## 2024-01-16 DIAGNOSIS — D123 Benign neoplasm of transverse colon: Secondary | ICD-10-CM | POA: Diagnosis not present

## 2024-01-16 DIAGNOSIS — K640 First degree hemorrhoids: Secondary | ICD-10-CM

## 2024-01-16 MED ORDER — SODIUM CHLORIDE 0.9 % IV SOLN: 500.0000 mL | Freq: Once | INTRAVENOUS | Status: DC

## 2024-01-16 NOTE — Patient Instructions (Signed)
Handouts given on polyps and diverticulosis.    YOU HAD AN ENDOSCOPIC PROCEDURE TODAY AT [facility]:   Refer to the procedure report that was given to you for any specific questions about what was found during the examination.  If the procedure report does not answer your questions, please call your gastroenterologist to clarify.  If you requested that your care partner not be given the details of your procedure findings, then the procedure report has been included in a sealed envelope for you to review at your convenience later.  YOU SHOULD EXPECT: Some feelings of bloating in the abdomen. Passage of more gas than usual.  Walking can help get rid of the air that was put into your GI tract during the procedure and reduce the bloating. If you had a lower endoscopy (such as a colonoscopy or flexible sigmoidoscopy) you may notice spotting of blood in your stool or on the toilet paper. If you underwent a bowel prep for your procedure, you may not have a normal bowel movement for a few days.  Please Note:  You might notice some irritation and congestion in your nose or some drainage.  This is from the oxygen used during your procedure.  There is no need for concern and it should clear up in a day or so.  SYMPTOMS TO REPORT IMMEDIATELY:  Following lower endoscopy (colonoscopy or flexible sigmoidoscopy):  Excessive amounts of blood in the stool  Significant tenderness or worsening of abdominal pains  Swelling of the abdomen that is new, acute  Fever of 100F or higher   For urgent or emergent issues, a gastroenterologist can be reached at any hour by calling [phone]. Do not use MyChart messaging for urgent concerns.    DIET:  We do recommend a small meal at first, but then you may proceed to your regular diet.  Drink plenty of fluids but you should avoid alcoholic beverages for 24 hours.  ACTIVITY:  You should plan to take it easy for the rest of today and you should NOT  DRIVE or use heavy machinery until tomorrow (because of the sedation medicines used during the test).    FOLLOW UP: Our staff will call the number listed on your records the next business day following your procedure.  We will call around 7:15- 8:00 am to check on you and address any questions or concerns that you may have regarding the information given to you following your procedure. If we do not reach you, we will leave a message.     If any biopsies were taken you will be contacted by phone or by letter within the next 1-3 weeks.  Please call us at [phone] if you have not heard about the biopsies in 3 weeks.    SIGNATURES/CONFIDENTIALITY: You and/or your care partner have signed paperwork which will be entered into your electronic medical record.  These signatures attest to the fact that that the information above on your After Visit Summary has been reviewed and is understood.  Full responsibility of the confidentiality of this discharge information lies with you and/or your care-partner.

## 2024-01-16 NOTE — Progress Notes (Signed)
 [facility] History and Physical   Primary Care Physician:  Philemon Kingdom, MD   Reason for Procedure:   CRC screening  Plan:    Colon     HPI: Tracy Montgomery is a 53 y.o. female  No nausea, vomiting, heartburn, regurgitation, odynophagia or dysphagia.  No significant diarrhea or constipation.  No melena or hematochezia. No unintentional weight loss. No abdominal pain.  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  Past Medical History:  Diagnosis Date   Allergy    Hypertension    Thyroid disease     Past Surgical History:  Procedure Laterality Date   FOOT SURGERY  1978   3 bening cyst removed   THYROID LOBECTOMY N/A 07/31/2019   Procedure: LEFT THYROID LOBECTOMY, ISTHMUSECTOMY;  Surgeon: Darnell Level, MD;  Location: [facility];  Service: General;  Laterality: N/A;    Prior to Admission medications   Medication Sig Start Date End Date Taking? Authorizing Provider  amLODipine-valsartan (EXFORGE) 5-160 MG tablet Take 1 tablet by mouth daily.   Yes [provider]  Ergocalciferol (VITAMIN D2 PO) 1 capsule every week by oral route. 06/16/22  Yes [provider]  levothyroxine (SYNTHROID) 50 MCG tablet TAKE 1 TABLET BY MOUTH EVERY MORNING 10/01/20 01/16/24 Yes Darnell Level, MD  ibuprofen (ADVIL) 200 MG tablet     [provider]    Current Outpatient Medications  Medication Sig Dispense Refill   amLODipine-valsartan (EXFORGE) 5-160 MG tablet Take 1 tablet by mouth daily.     Ergocalciferol (VITAMIN D2 PO) 1 capsule every week by oral route.     levothyroxine (SYNTHROID) 50 MCG tablet TAKE 1 TABLET BY MOUTH EVERY MORNING 30 tablet 3   ibuprofen (ADVIL) 200 MG tablet      Current Facility-Administered Medications  Medication Dose Route Frequency Provider Last Rate Last Admin   0.9 %  sodium chloride infusion  500 mL Intravenous Once Lynann Bologna, MD        Allergies as of 01/16/2024 - Review Complete 01/16/2024  Allergen Reaction Noted    Ceftin [cefuroxime axetil] Swelling 05/29/2016   Amoxicillin Swelling 01/02/2024    Family History  Problem Relation Age of Onset   Colon polyps Mother    Colon polyps Paternal Grandmother    Colon cancer Paternal Grandmother    Esophageal cancer Neg Hx    Stomach cancer Neg Hx    Rectal cancer Neg Hx     Social History   Socioeconomic History   Marital status: Married    Spouse name: Not on file   Number of children: Not on file   Years of education: Not on file   Highest education level: Not on file  Occupational History   Not on file  Tobacco Use   Smoking status: Never   Smokeless tobacco: Never  Vaping Use   Vaping status: Never Used  Substance and Sexual Activity   Alcohol use: Never   Drug use: Never   Sexual activity: Yes    Comment: husband has vasectemy  Other Topics Concern   Not on file  Social History Narrative   Not on file   Social Drivers of Health   Financial Resource Strain: Not on file  Food Insecurity: Not on file  Transportation Needs: Not on file  Physical Activity: Not on file  Stress: Not on file  Social Connections: Not on file  Intimate Partner Violence: Not on file    Review of Systems: Positive for none All other review of  systems negative except as mentioned in the HPI.  Physical Exam: Vital signs in last 24 hours: @VSRANGES @   General:   Alert,  Well-developed, well-nourished, pleasant and cooperative in NAD Lungs:  Clear throughout to auscultation.   Heart:  Regular rate and rhythm; no murmurs, clicks, rubs,  or gallops. Abdomen:  Soft, nontender and nondistended. Normal bowel sounds.   Neuro/Psych:  Alert and cooperative. Normal mood and affect. A and O x 3    No significant changes were identified.  The patient continues to be an appropriate candidate for the planned procedure and anesthesia.   Edman Circle, MD. [facility] 01/16/2024 11:18 AM@

## 2024-01-16 NOTE — Progress Notes (Signed)
 Pt's states no medical or surgical changes since previsit or office visit.

## 2024-01-16 NOTE — Progress Notes (Signed)
 Pt sedate, gd SR's, VSS, report to RN

## 2024-01-16 NOTE — Progress Notes (Signed)
 Called to room to assist during endoscopic procedure.  Patient ID and intended procedure confirmed with present staff. Received instructions for my participation in the procedure from the performing physician.

## 2024-01-16 NOTE — Op Note (Signed)
 [facility] Patient Name: Tracy Montgomery Procedure Date: 01/16/2024 11:17 AM MRN: [id number] Endoscopist: Lynann Bologna , MD, [id number] Age: 53 Referring MD:  Date of Birth: [birth year] Gender: Female Account #: [id number] Procedure:                Colonoscopy Indications:              Screening for colorectal malignant neoplasm Medicines:                Monitored Anesthesia Care Procedure:                Pre-Anesthesia Assessment:                           - Prior to the procedure, a History and Physical                            was performed, and patient medications and                            allergies were reviewed. The patient's tolerance of                            previous anesthesia was also reviewed. The risks                            and benefits of the procedure and the sedation                            options and risks were discussed with the patient.                            All questions were answered, and informed consent                            was obtained. Prior Anticoagulants: The patient has                            taken no anticoagulant or antiplatelet agents. ASA                            Grade Assessment: II - A patient with mild systemic                            disease. After reviewing the risks and benefits,                            the patient was deemed in satisfactory condition to                            undergo the procedure.                           After obtaining informed consent, the colonoscope  was passed under direct vision. Throughout the                            procedure, the patient's blood pressure, pulse, and                            oxygen saturations were monitored continuously. The                            Olympus Scope SN: [id number] was introduced through                            the anus and advanced to the 2 cm into the ileum.                            The colonoscopy  was performed without difficulty.                            The patient tolerated the procedure well. The                            quality of the bowel preparation was good. The                            terminal ileum, ileocecal valve, appendiceal                            orifice, and rectum were photographed. Scope In: 11:28:17 AM Scope Out: 11:48:55 AM Scope Withdrawal Time: 0 hours 13 minutes 48 seconds  Total Procedure Duration: 0 hours 20 minutes 38 seconds  Findings:                 Three flat sessile polyps were found in the mid                            descending colon, proximal transverse colon and mid                            transverse colon. The polyps were 6 to 8 mm in                            size. These polyps were removed with a cold snare.                            Resection and retrieval were complete.                           A few small-mouthed diverticula were found in the                            sigmoid colon.                           Non-bleeding internal hemorrhoids were found  during                            retroflexion. The hemorrhoids were small and Grade                            I (internal hemorrhoids that do not prolapse).                           The terminal ileum appeared normal.                           The exam was otherwise without abnormality on                            direct and retroflexion views. Complications:            No immediate complications. Estimated Blood Loss:     Estimated blood loss: none. Impression:               - Three 6 to 8 mm polyps in the mid descending                            colon, in the proximal transverse colon and in the                            mid transverse colon, removed with a cold snare.                            Resected and retrieved.                           - Mild sigmoid diverticulosis.                           - The examined portion of the ileum was normal.                            - The examination was otherwise normal on direct                            and retroflexion views. Recommendation:           - Patient has a contact number available for                            emergencies. The signs and symptoms of potential                            delayed complications were discussed with the                            patient. Return to normal activities tomorrow.                            Written discharge instructions were provided to the  patient.                           - High fiber diet.                           - Continue present medications.                           - Await pathology results.                           - Repeat colonoscopy for surveillance based on                            pathology results.                           - The findings and recommendations were discussed                            with the patient's family. Lynann Bologna, MD 01/16/2024 11:54:37 AM This report has been signed electronically.

## 2024-01-17 ENCOUNTER — Telehealth: Payer: Self-pay

## 2024-01-17 NOTE — Telephone Encounter (Signed)
  Follow up Call-     01/16/2024   10:45 AM  Call back number  Post procedure Call Back phone  # [phone]  Permission to leave phone message Yes     Patient questions:  Do you have a fever, pain , or abdominal swelling? No. Pain Score  0 *  Have you tolerated food without any problems? Yes.    Have you been able to return to your normal activities? Yes.    Do you have any questions about your discharge instructions: Diet   No. Medications  No. Follow up visit  No.  Do you have questions or concerns about your Care? No.  Actions: * If pain score is 4 or above: No action needed, pain <4.

## 2024-01-19 LAB — SURGICAL PATHOLOGY

## 2024-01-21 ENCOUNTER — Encounter: Payer: Self-pay | Admitting: Gastroenterology
